# Patient Record
Sex: Female | Born: 1965
Health system: Southern US, Community
[De-identification: ages and names within clinical notes are randomized; demographics above are authoritative.]

## PROBLEM LIST (undated history)

## (undated) DIAGNOSIS — E785 Hyperlipidemia, unspecified: Secondary | ICD-10-CM

## (undated) DIAGNOSIS — I10 Essential (primary) hypertension: Secondary | ICD-10-CM

## (undated) DIAGNOSIS — G4730 Sleep apnea, unspecified: Secondary | ICD-10-CM

## (undated) DIAGNOSIS — E119 Type 2 diabetes mellitus without complications: Secondary | ICD-10-CM

## (undated) DIAGNOSIS — G4733 Obstructive sleep apnea (adult) (pediatric): Secondary | ICD-10-CM

## (undated) DIAGNOSIS — D649 Anemia, unspecified: Secondary | ICD-10-CM

## (undated) HISTORY — DX: Hyperlipidemia, unspecified: E78.5

## (undated) HISTORY — PX: REDUCTION MAMMAPLASTY: SUR839

## (undated) HISTORY — PX: APPENDECTOMY: SHX54

## (undated) HISTORY — DX: Obstructive sleep apnea (adult) (pediatric): G47.33

---

## 2001-08-24 HISTORY — PX: APPENDECTOMY: SHX54

## 2008-08-24 HISTORY — PX: REDUCTION MAMMAPLASTY: SUR839

## 2014-10-25 ENCOUNTER — Other Ambulatory Visit: Payer: Self-pay | Admitting: Family

## 2014-10-25 DIAGNOSIS — Z9889 Other specified postprocedural states: Secondary | ICD-10-CM

## 2014-10-25 DIAGNOSIS — Z1231 Encounter for screening mammogram for malignant neoplasm of breast: Secondary | ICD-10-CM

## 2014-11-01 ENCOUNTER — Ambulatory Visit
Admission: RE | Admit: 2014-11-01 | Discharge: 2014-11-01 | Disposition: A | Discharge status: Home / Self Care | Payer: 59 | Source: Ambulatory Visit | Attending: Family | Admitting: Family

## 2014-11-01 ENCOUNTER — Encounter (INDEPENDENT_AMBULATORY_CARE_PROVIDER_SITE_OTHER): Payer: Self-pay

## 2014-11-01 DIAGNOSIS — Z1231 Encounter for screening mammogram for malignant neoplasm of breast: Secondary | ICD-10-CM

## 2014-11-01 DIAGNOSIS — Z9889 Other specified postprocedural states: Secondary | ICD-10-CM

## 2015-08-28 ENCOUNTER — Other Ambulatory Visit (HOSPITAL_COMMUNITY)
Admission: RE | Admit: 2015-08-28 | Discharge: 2015-08-28 | Disposition: A | Discharge status: Home / Self Care | Payer: 59 | Source: Ambulatory Visit | Attending: Obstetrics & Gynecology | Admitting: Obstetrics & Gynecology

## 2015-08-28 ENCOUNTER — Other Ambulatory Visit: Payer: Self-pay | Admitting: Obstetrics & Gynecology

## 2015-08-28 DIAGNOSIS — Z01419 Encounter for gynecological examination (general) (routine) without abnormal findings: Secondary | ICD-10-CM | POA: Insufficient documentation

## 2015-08-28 DIAGNOSIS — Z1151 Encounter for screening for human papillomavirus (HPV): Secondary | ICD-10-CM | POA: Diagnosis not present

## 2015-09-02 LAB — CYTOLOGY - PAP

## 2015-09-03 ENCOUNTER — Ambulatory Visit: Payer: 59 | Admitting: *Deleted

## 2015-09-16 ENCOUNTER — Other Ambulatory Visit: Payer: Self-pay | Admitting: Obstetrics & Gynecology

## 2016-01-29 ENCOUNTER — Other Ambulatory Visit: Payer: Self-pay | Admitting: Family

## 2016-01-29 DIAGNOSIS — Z9889 Other specified postprocedural states: Secondary | ICD-10-CM

## 2016-01-29 DIAGNOSIS — Z1231 Encounter for screening mammogram for malignant neoplasm of breast: Secondary | ICD-10-CM

## 2016-01-31 ENCOUNTER — Ambulatory Visit
Admission: RE | Admit: 2016-01-31 | Discharge: 2016-01-31 | Disposition: A | Discharge status: Home / Self Care | Payer: 59 | Source: Ambulatory Visit | Attending: Family | Admitting: Family

## 2016-01-31 DIAGNOSIS — Z1231 Encounter for screening mammogram for malignant neoplasm of breast: Secondary | ICD-10-CM

## 2016-01-31 DIAGNOSIS — Z9889 Other specified postprocedural states: Secondary | ICD-10-CM

## 2017-03-16 ENCOUNTER — Other Ambulatory Visit: Payer: Self-pay | Admitting: Family

## 2017-03-16 DIAGNOSIS — Z1231 Encounter for screening mammogram for malignant neoplasm of breast: Secondary | ICD-10-CM

## 2017-03-24 ENCOUNTER — Ambulatory Visit
Admission: RE | Admit: 2017-03-24 | Discharge: 2017-03-24 | Disposition: A | Discharge status: Home / Self Care | Payer: 59 | Source: Ambulatory Visit | Attending: Family | Admitting: Family

## 2017-03-24 DIAGNOSIS — Z1231 Encounter for screening mammogram for malignant neoplasm of breast: Secondary | ICD-10-CM

## 2017-10-28 ENCOUNTER — Other Ambulatory Visit: Payer: Self-pay | Admitting: Gastroenterology

## 2017-12-10 NOTE — Progress Notes (Signed)
Left voice message on Endoscopy after hour voice mailbox making staff aware that an assessment was not completed because pt was unavailable. Pre-op instructions were left on pt voice mailbox according to pre-op checklist. Pt assessment will need to be completed upon pt arrival on DOS.

## 2017-12-12 NOTE — Anesthesia Preprocedure Evaluation (Addendum)
Anesthesia Evaluation  Patient identified by MRN, date of birth, ID band Patient awake    Reviewed: Allergy & Precautions, NPO status , Patient's Chart, lab work & pertinent test results  Airway Mallampati: I  TM Distance: >3 FB Neck ROM: Full    Dental  (+) Dental Advisory Given, Loose,    Pulmonary sleep apnea and Continuous Positive Airway Pressure Ventilation ,    Pulmonary exam normal breath sounds clear to auscultation       Cardiovascular hypertension, Pt. on medications (-) anginaNormal cardiovascular exam Rhythm:Regular Rate:Normal     Neuro/Psych negative neurological ROS  negative psych ROS   GI/Hepatic negative GI ROS, Neg liver ROS,   Endo/Other  diabetes, Poorly Controlled, Type 2, Insulin DependentMorbid obesity  Renal/GU negative Renal ROS  negative genitourinary   Musculoskeletal negative musculoskeletal ROS (+)   Abdominal (+) + obese,   Peds  Hematology negative hematology ROS (+)   Anesthesia Other Findings   Reproductive/Obstetrics                          Anesthesia Physical Anesthesia Plan  ASA: III  Anesthesia Plan: MAC   Post-op Pain Management:    Induction: Intravenous  PONV Risk Score and Plan: Propofol infusion and Treatment may vary due to age or medical condition  Airway Management Planned: Nasal Cannula and Natural Airway  Additional Equipment: None  Intra-op Plan:   Post-operative Plan:   Informed Consent: I have reviewed the patients History and Physical, chart, labs and discussed the procedure including the risks, benefits and alternatives for the proposed anesthesia with the patient or authorized representative who has indicated his/her understanding and acceptance.     Plan Discussed with: CRNA and Anesthesiologist  Anesthesia Plan Comments:         Anesthesia Quick Evaluation

## 2017-12-13 ENCOUNTER — Ambulatory Visit (HOSPITAL_COMMUNITY)
Admission: RE | Admit: 2017-12-13 | Discharge: 2017-12-13 | Disposition: A | Discharge status: Home / Self Care | Payer: 59 | Source: Ambulatory Visit | Attending: Gastroenterology | Admitting: Gastroenterology

## 2017-12-13 ENCOUNTER — Ambulatory Visit (HOSPITAL_COMMUNITY): Payer: 59 | Admitting: Anesthesiology

## 2017-12-13 ENCOUNTER — Other Ambulatory Visit: Payer: Self-pay

## 2017-12-13 ENCOUNTER — Encounter (HOSPITAL_COMMUNITY)
Admission: RE | Disposition: A | Discharge status: Home / Self Care | Payer: Self-pay | Source: Ambulatory Visit | Attending: Gastroenterology

## 2017-12-13 ENCOUNTER — Encounter (HOSPITAL_COMMUNITY): Payer: Self-pay

## 2017-12-13 DIAGNOSIS — E119 Type 2 diabetes mellitus without complications: Secondary | ICD-10-CM | POA: Insufficient documentation

## 2017-12-13 DIAGNOSIS — G4733 Obstructive sleep apnea (adult) (pediatric): Secondary | ICD-10-CM | POA: Insufficient documentation

## 2017-12-13 DIAGNOSIS — Z794 Long term (current) use of insulin: Secondary | ICD-10-CM | POA: Diagnosis not present

## 2017-12-13 DIAGNOSIS — I10 Essential (primary) hypertension: Secondary | ICD-10-CM | POA: Diagnosis not present

## 2017-12-13 DIAGNOSIS — D123 Benign neoplasm of transverse colon: Secondary | ICD-10-CM | POA: Diagnosis not present

## 2017-12-13 DIAGNOSIS — Z6841 Body Mass Index (BMI) 40.0 and over, adult: Secondary | ICD-10-CM | POA: Insufficient documentation

## 2017-12-13 DIAGNOSIS — Z1211 Encounter for screening for malignant neoplasm of colon: Secondary | ICD-10-CM | POA: Insufficient documentation

## 2017-12-13 HISTORY — DX: Type 2 diabetes mellitus without complications: E11.9

## 2017-12-13 HISTORY — DX: Essential (primary) hypertension: I10

## 2017-12-13 HISTORY — DX: Sleep apnea, unspecified: G47.30

## 2017-12-13 HISTORY — PX: COLONOSCOPY WITH PROPOFOL: SHX5780

## 2017-12-13 LAB — GLUCOSE, CAPILLARY: Glucose-Capillary: 227 mg/dL — ABNORMAL HIGH (ref 65–99)

## 2017-12-13 SURGERY — COLONOSCOPY WITH PROPOFOL
Anesthesia: Monitor Anesthesia Care

## 2017-12-13 MED ORDER — SODIUM CHLORIDE 0.9 % IV SOLN: INTRAVENOUS | Status: DC

## 2017-12-13 MED ORDER — LACTATED RINGERS IV SOLN
INTRAVENOUS | Status: DC | PRN
  Administered 2017-12-13: 09:00:00 via INTRAVENOUS

## 2017-12-13 MED ORDER — PROPOFOL 500 MG/50ML IV EMUL
INTRAVENOUS | Status: DC | PRN
  Administered 2017-12-13: 100 ug/kg/min via INTRAVENOUS

## 2017-12-13 MED ORDER — PROPOFOL 10 MG/ML IV BOLUS
INTRAVENOUS | Status: DC | PRN
  Administered 2017-12-13: 40 mg via INTRAVENOUS
  Administered 2017-12-13 (×2): 10 mg via INTRAVENOUS

## 2017-12-13 SURGICAL SUPPLY — 21 items

## 2017-12-13 NOTE — Transfer of Care (Signed)
Immediate Anesthesia Transfer of Care Note  Patient: Amy [name]  Procedure(s) Performed: COLONOSCOPY WITH PROPOFOL (N/A )  Patient Location: Endoscopy Unit  Anesthesia Type:MAC  Level of Consciousness: awake and patient cooperative  Airway & Oxygen Therapy: Patient Spontanous Breathing  Post-op Assessment: Report given to RN and Post -op Vital signs reviewed and stable  Post vital signs: Reviewed and stable  Last Vitals:  Vitals Value Taken Time  BP 122/47 12/13/2017  9:45 AM  Temp 36.5 C 12/13/2017  9:45 AM  Pulse    Resp 21 12/13/2017  9:46 AM  SpO2    Vitals shown include unvalidated device data.  Last Pain:  Vitals:   12/13/17 0945  TempSrc: Oral  PainSc: 0-No pain         Complications: No apparent anesthesia complications

## 2017-12-13 NOTE — Anesthesia Postprocedure Evaluation (Signed)
Anesthesia Post Note  Patient: Amy [name]  Procedure(s) Performed: COLONOSCOPY WITH PROPOFOL (N/A )     Patient location during evaluation: PACU Anesthesia Type: MAC Level of consciousness: awake and alert Pain management: pain level controlled Vital Signs Assessment: post-procedure vital signs reviewed and stable Respiratory status: spontaneous breathing, nonlabored ventilation and respiratory function stable Cardiovascular status: stable and blood pressure returned to baseline Anesthetic complications: no    Last Vitals:  Vitals:   12/13/17 0955 12/13/17 1005  BP: (!) 145/52 (!) 155/47  Pulse: 81 79  Resp: (!) 22 18  Temp:    SpO2: 100% 100%    Last Pain:  Vitals:   12/13/17 1005  TempSrc:   PainSc: 0-No pain                 Audry Pili

## 2017-12-13 NOTE — Anesthesia Procedure Notes (Signed)
Procedure Name: MAC Date/Time: 12/13/2017 9:12 AM Performed by: Lance Coon, CRNA Pre-anesthesia Checklist: Patient identified, Emergency Drugs available, Suction available, Patient being monitored and Timeout performed Patient Re-evaluated:Patient Re-evaluated prior to induction Oxygen Delivery Method: Nasal cannula

## 2017-12-13 NOTE — H&P (Signed)
Primary Care Physician:  Patient, No Pcp Per Primary Gastroenterologist:  Dr. Alessandra Bevels  Reason for Visit  :   HPI: Amy [name] is a 52 y.o. female  with past medical history of morbid obesity with BMI of more than 53, uncontrolled diabetes with hemoglobin A1c of 12.5, obstructive sleep apnea on CPAP, diabetes here for screening colonoscopy. Patient denies any GI symptoms today. She denies any blood in the stool or black stool. Denied any abdominal pain, nausea or vomiting. Denied chronic diarrhea or chronic constipation. Brother was diagnosed with large colon polyp requiring surgical resection in his 26s. Sisiter was also diagnosed with benign polyps. No family history of colon cancer. Patient has been anemic since early childhood which was attributed to heavy menstruation. Her hemoglobin has improved.   Past Medical History:  Diagnosis Date  . Diabetes mellitus without complication ([place])   . Hypertension   . Sleep apnea     Past Surgical History:  Procedure Laterality Date  . APPENDECTOMY      Prior to Admission medications   Medication Sig Start Date End Date Taking? Authorizing Provider  Cholecalciferol (VITAMIN D3 PO) Take 2 capsules by mouth daily.   Yes [provider]  ferrous sulfate 325 (65 FE) MG tablet Take 325 mg by mouth daily with breakfast.   Yes [provider]  Insulin Degludec (TRESIBA FLEXTOUCH) 200 UNIT/ML SOPN Inject 30 Units into the skin at bedtime.   Yes [provider]  insulin lispro (HUMALOG KWIKPEN) 100 UNIT/ML KiwkPen Inject 5-12 Units into the skin 3 (three) times daily. Per sliding scale   Yes [provider]  irbesartan-hydrochlorothiazide (AVALIDE) 300-12.5 MG tablet Take 1 tablet by mouth daily.   Yes [provider]  simvastatin (ZOCOR) 40 MG tablet Take 40 mg by mouth every evening.   Yes [provider]    Scheduled Meds: Continuous Infusions: . sodium chloride     PRN  Meds:.  Allergies as of 10/28/2017  . (Not on File)    History reviewed. No pertinent family history.  Social History   Socioeconomic History  . Marital status: Single    Spouse name: Not on file  . Number of children: Not on file  . Years of education: Not on file  . Highest education level: Not on file  Occupational History  . Not on file  Social Needs  . Financial resource strain: Not on file  . Food insecurity:    Worry: Not on file    Inability: Not on file  . Transportation needs:    Medical: Not on file    Non-medical: Not on file  Tobacco Use  . Smoking status: Never Smoker  . Smokeless tobacco: Never Used  Substance and Sexual Activity  . Alcohol use: Not on file  . Drug use: Not on file  . Sexual activity: Not on file  Lifestyle  . Physical activity:    Days per week: Not on file    Minutes per session: Not on file  . Stress: Not on file  Relationships  . Social connections:    Talks on phone: Not on file    Gets together: Not on file    Attends religious service: Not on file    Active member of club or organization: Not on file    Attends meetings of clubs or organizations: Not on file    Relationship status: Not on file  . Intimate partner violence:    Fear of current or ex partner:  Not on file    Emotionally abused: Not on file    Physically abused: Not on file    Forced sexual activity: Not on file  Other Topics Concern  . Not on file  Social History Narrative  . Not on file    Review of Systems: All negative except as stated above in HPI.  Physical Exam: Vital signs: Vitals:   12/13/17 0749  BP: (!) 170/63  Pulse: 92  Resp: (!) 22  Temp: 98.7 F (37.1 C)  SpO2: 99%     General:   Alert, morbidly obese, pleasant and cooperative in NAD Lungs:  Clear throughout to auscultation.   No wheezes, crackles, or rhonchi. No acute distress. Heart:  Regular rate and rhythm; no murmurs, clicks, rubs,  or gallops. Abdomen: obese, soft,  nontender, nondistended, bowel sounds present. No peritoneal signs   GI:  Lab Results: No results for input(s): WBC, HGB, HCT, PLT in the last 72 hours. BMET No results for input(s): NA, K, CL, CO2, GLUCOSE, BUN, CREATININE, CALCIUM in the last 72 hours. LFT No results for input(s): PROT, ALBUMIN, AST, ALT, ALKPHOS, BILITOT, BILIDIR, IBILI in the last 72 hours. PT/INR No results for input(s): LABPROT, INR in the last 72 hours.   Studies/Results: No results found.  Impression/Plan: Screening for colon cancer  Recommendations ----------------------------- - Proceed with colonoscopy today.  Risks (bleeding, infection, bowel perforation that could require surgery, sedation-related changes in cardiopulmonary systems), benefits (identification and possible treatment of source of symptoms, exclusion of certain causes of symptoms), and alternatives (watchful waiting, radiographic imaging studies, empiric medical treatment)  were explained to patient and family in detail and patient wishes to proceed.    LOS: 0 days   Otis Brace  MD, FACP 12/13/2017, 8:37 AM  Contact #  [phone]

## 2017-12-13 NOTE — Op Note (Addendum)
[facility] Patient Name: Amy [name] Procedure Date : 12/13/2017 MRN: [id number] Attending MD: Otis Brace , MD Date of Birth: [birth year] CSN: [id number] Age: 52 Admit Type: Outpatient Procedure:                Colonoscopy Indications:              Screening for colorectal malignant neoplasm, This                            is the patient's first colonoscopy Providers:                Otis Brace, MD, Kingsley Plan, RN, Charolette Child, Technician, Lance Coon, CRNA Referring MD:              Medicines:                Sedation Administered by an Anesthesia Professional Complications:            No immediate complications. Estimated Blood Loss:     Estimated blood loss was minimal. Procedure:                Pre-Anesthesia Assessment:                           - Prior to the procedure, a History and Physical                            was performed, and patient medications and                            allergies were reviewed. The patient's tolerance of                            previous anesthesia was also reviewed. The risks                            and benefits of the procedure and the sedation                            options and risks were discussed with the patient.                            All questions were answered, and informed consent                            was obtained. Prior Anticoagulants: The patient has                            taken no previous anticoagulant or antiplatelet                            agents. ASA Grade Assessment: III - A patient with  severe systemic disease. After reviewing the risks                            and benefits, the patient was deemed in                            satisfactory condition to undergo the procedure.                           After obtaining informed consent, the colonoscope                            was passed under direct vision.  Throughout the                            procedure, the patient's blood pressure, pulse, and                            oxygen saturations were monitored continuously. The                            EC-[id number] ([id number]) scope was introduced through                            the anus and advanced to the the cecum, identified                            by appendiceal orifice and ileocecal valve. The                            colonoscopy was technically difficult and complex                            due to a redundant colon. Successful completion of                            the procedure was aided by applying abdominal                            pressure. The patient tolerated the procedure well.                            The quality of the bowel preparation was fair. Scope In: 9:16:55 AM Scope Out: 9:40:04 AM Scope Withdrawal Time: 0 hours 12 minutes 8 seconds  Total Procedure Duration: 0 hours 23 minutes 9 seconds  Findings:      The perianal and digital rectal examinations were normal.      A moderate amount of liquid stool was found in the sigmoid colon, in the       ascending colon and in the cecum, interfering with visualization. Lavage       of the area was performed, resulting in clearance with fair       visualization.      A 8 mm polyp was found in the transverse colon. The polyp was  sessile.       The polyp was removed with a hot snare. Resection and retrieval were       complete.      There was moderate spasm in the sigmoid colon with limited distention of       the sigmoid colon..      Internal hemorrhoids were found during retroflexion. The hemorrhoids       were small. Impression:               - Preparation of the colon was fair.                           - Stool in the sigmoid colon, in the ascending                            colon and in the cecum.                           - One 8 mm polyp in the transverse colon, removed                            with a hot  snare. Resected and retrieved.                           - Moderate colonic spasm.                           - Internal hemorrhoids. Recommendation:           - Patient has a contact number available for                            emergencies. The signs and symptoms of potential                            delayed complications were discussed with the                            patient. Return to normal activities tomorrow.                            Written discharge instructions were provided to the                            patient.                           - Resume previous diet.                           - Continue present medications.                           - Await pathology results.                           - Repeat colonoscopy in 3 - 5 years for  surveillance based on pathology results.                           - Return to my office PRN. Procedure Code(s):        --- Professional ---                           [phone], Colonoscopy, flexible; with removal of                            tumor(s), polyp(s), or other lesion(s) by snare                            technique Diagnosis Code(s):        --- Professional ---                           Z12.11, Encounter for screening for malignant                            neoplasm of colon                           K64.8, Other hemorrhoids                           K58.9, Irritable bowel syndrome without diarrhea                           D12.3, Benign neoplasm of transverse colon (hepatic                            flexure or splenic flexure) CPT copyright 2017 American Medical Association. All rights reserved. The codes documented in this report are preliminary and upon coder review may  be revised to meet current compliance requirements. Otis Brace, MD Otis Brace, MD 12/13/2017 9:48:32 AM Number of Addenda: 0

## 2017-12-13 NOTE — Discharge Instructions (Signed)

## 2018-03-31 ENCOUNTER — Other Ambulatory Visit: Payer: Self-pay | Admitting: Nurse Practitioner

## 2018-03-31 DIAGNOSIS — Z1231 Encounter for screening mammogram for malignant neoplasm of breast: Secondary | ICD-10-CM

## 2018-04-26 ENCOUNTER — Ambulatory Visit
Admission: RE | Admit: 2018-04-26 | Discharge: 2018-04-26 | Disposition: A | Discharge status: Home / Self Care | Payer: 59 | Source: Ambulatory Visit | Attending: Nurse Practitioner | Admitting: Nurse Practitioner

## 2018-04-26 DIAGNOSIS — Z1231 Encounter for screening mammogram for malignant neoplasm of breast: Secondary | ICD-10-CM

## 2019-05-03 ENCOUNTER — Other Ambulatory Visit: Payer: Self-pay

## 2019-05-05 ENCOUNTER — Ambulatory Visit (INDEPENDENT_AMBULATORY_CARE_PROVIDER_SITE_OTHER): Payer: 59 | Admitting: Internal Medicine

## 2019-05-05 ENCOUNTER — Encounter: Payer: Self-pay | Admitting: Internal Medicine

## 2019-05-05 ENCOUNTER — Other Ambulatory Visit: Payer: Self-pay

## 2019-05-05 VITALS — BP 152/78 | HR 97 | Temp 98.7°F | Ht 65.0 in | Wt 336.4 lb

## 2019-05-05 DIAGNOSIS — R739 Hyperglycemia, unspecified: Secondary | ICD-10-CM | POA: Diagnosis not present

## 2019-05-05 DIAGNOSIS — Z794 Long term (current) use of insulin: Secondary | ICD-10-CM

## 2019-05-05 DIAGNOSIS — E1165 Type 2 diabetes mellitus with hyperglycemia: Secondary | ICD-10-CM

## 2019-05-05 DIAGNOSIS — I10 Essential (primary) hypertension: Secondary | ICD-10-CM

## 2019-05-05 DIAGNOSIS — E785 Hyperlipidemia, unspecified: Secondary | ICD-10-CM | POA: Diagnosis not present

## 2019-05-05 LAB — GLUCOSE, POCT (MANUAL RESULT ENTRY): POC Glucose: 296 mg/dl — AB (ref 70–99)

## 2019-05-05 MED ORDER — OZEMPIC (0.25 OR 0.5 MG/DOSE) 2 MG/1.5ML ~~LOC~~ SOPN: 0.5000 mg | PEN_INJECTOR | SUBCUTANEOUS | 3 refills | Status: DC

## 2019-05-05 NOTE — Progress Notes (Signed)
Name: Amy [name]  MRN/ DOB: [id number], 04/12/1966   Age/ Sex: 53 y.o., female    PCP: Leeroy Cha, MD   Reason for Endocrinology Evaluation: Type 2  Diabetes Mellitus     Date of Initial Endocrinology Visit: 05/05/2019     PATIENT IDENTIFIER: Amy [name] is a 53 y.o. female with a past medical history of HTN,T2DM, OSA and anemia. The patient presented for initial endocrinology clinic visit on 05/05/2019 for consultative assistance with her diabetes management.    HPI: Amy [name] was    Diagnosed with DM 2010 Prior Medications tried/Intolerance: Metformin- Diarrhea. Has been on insulin for years Currently checking blood sugars occasionally  Hypoglycemia episodes : no              Hemoglobin A1c has ranged from 7.9% in 2011, peaking at 12.0% in 2019. Patient required assistance for hypoglycemia: no Patient has required hospitalization within the last 1 year from hyper or hypoglycemia: no   In terms of diet, the patient eats 3 meals day, snacks 2 x a day, drinks  Diet drinks.   Used to see Dr. Chalmers Cater    Works from home for [employer]  She moved here to be near her daughter while going through college but daughter has graduated and she is working in [place]: Humalog SS  Tresiba 30-40 units daily   Statin: yes ACE-I/ARB: yes Prior Diabetic Education:No   METER DOWNLOAD SUMMARY: Did not bring    DIABETIC COMPLICATIONS: Microvascular complications:    Denies: CKD, neuropathy , retinopathy   Last eye exam: Completed 2019  Macrovascular complications:    Denies: CAD, PVD, CVA   PAST HISTORY: Past Medical History:  Past Medical History:  Diagnosis Date  . Diabetes mellitus without complication ([place])   . Hypertension   . Sleep apnea    Past Surgical History:  Past Surgical History:  Procedure Laterality Date  . APPENDECTOMY    . COLONOSCOPY WITH PROPOFOL N/A 12/13/2017   Procedure: COLONOSCOPY  WITH PROPOFOL;  Surgeon: Otis Brace, MD;  Location: [place];  Service: Gastroenterology;  Laterality: N/A;  . REDUCTION MAMMAPLASTY Bilateral       Social History:  reports that she has never smoked. She has never used smokeless tobacco. No history on file for alcohol and drug. Family History:  Family History  Problem Relation Age of Onset  . Breast cancer Cousin   . Aneurysm Father   . Diabetes Brother      HOME MEDICATIONS: Allergies as of 05/05/2019      Reactions   Lisinopril Swelling      Medication List       Accurate as of May 05, 2019  2:58 PM. If you have any questions, ask your nurse or doctor.        ferrous sulfate 325 (65 FE) MG tablet Take 325 mg by mouth daily with breakfast.   HumaLOG KwikPen 100 UNIT/ML KiwkPen Generic drug: insulin lispro Inject 5-12 Units into the skin 3 (three) times daily. Per sliding scale   irbesartan-hydrochlorothiazide 300-12.5 MG tablet Commonly known as: AVALIDE Take 1 tablet by mouth daily.   simvastatin 40 MG tablet Commonly known as: ZOCOR Take 40 mg by mouth every evening.   Tyler Aas FlexTouch 200 UNIT/ML Sopn Generic drug: Insulin Degludec Inject 40 Units into the skin at bedtime.   VITAMIN D3 PO Take 2 capsules by mouth daily.        ALLERGIES: Allergies  Allergen Reactions  . Lisinopril Swelling     REVIEW OF SYSTEMS: A comprehensive ROS was conducted with the patient and is negative except as per HPI and below:  Review of Systems  Constitutional: Negative for fever and weight loss.  HENT: Negative for congestion and sore throat.   Eyes: Negative for blurred vision and pain.  Respiratory: Negative for cough and shortness of breath.   Cardiovascular: Negative for chest pain and palpitations.  Gastrointestinal: Negative for diarrhea and nausea.  Genitourinary: Negative for frequency.  Skin: Negative.   Neurological: Negative for tingling and tremors.  Endo/Heme/Allergies: Negative  for polydipsia.  Psychiatric/Behavioral: Negative for depression. The patient is not nervous/anxious.       OBJECTIVE:   VITAL SIGNS: BP (!) 152/78 (BP Location: Left Arm, Patient Position: Sitting, Cuff Size: Large)   Pulse 97   Temp 98.7 F (37.1 C)   Ht 5\' 5"  (1.651 m)   Wt (!) 336 lb 6.4 oz (152.6 kg)   SpO2 97%   BMI 55.98 kg/m    PHYSICAL EXAM:  General: Pt appears well and is in NAD  Hydration: Well-hydrated with moist mucous membranes and good skin turgor  HEENT: Head: Unremarkable with good dentition. Oropharynx clear without exudate.  Eyes: External eye exam normal without stare, lid lag or exophthalmos.  EOM intact.   Neck: General: Supple without adenopathy or carotid bruits. Thyroid: Thyroid size normal.  No goiter or nodules appreciated. No thyroid bruit.  Lungs: Clear with good BS bilat with no rales, rhonchi, or wheezes  Heart: RRR with normal S1 and S2 and no gallops; no murmurs; no rub  Abdomen: Normoactive bowel sounds, soft, nontender, without masses or organomegaly palpable  Extremities:  Lower extremities - No pretibial edema. No lesions.  Skin: Normal texture and temperature to palpation. No rash noted.   Neuro: MS is good with appropriate affect, pt is alert and Ox3    DM foot exam: 05/05/2019  The skin of the feet is intact without sores or ulcerations. The pedal pulses are 2+ on right and 2+ on left. The sensation is intact to a screening 5.07, 10 gram monofilament bilaterally   DATA REVIEWED: 04/05/19 TG 110 LDL 112 HDL 35  Bun/Cr 12/0.7 GFR 87 K 4.5 Ca 8.9  A1c 11.0  %  ASSESSMENT / PLAN / RECOMMENDATIONS:   1) Type 2 Diabetes Mellitus, Poorly controlled, Without complications - Most recent A1c of 11.0 %. Goal A1c < 7.0 %.    Plan: GENERAL:  Its clearly she is not taking her insulin consistently, I am not clear how much tresiba she takes, at the beginning of the office visit , she said she takes 30 units, by the end of the visit  when I told her the dose is written for 40 she claimed she has been taking 40 units ?  I have discussed with the patient the pathophysiology of diabetes. We went over the natural progression of the disease. We talked about both insulin resistance and insulin deficiency. We stressed the importance of lifestyle changes including diet and exercise. I explained the complications associated with diabetes including retinopathy, nephropathy, neuropathy as well as increased risk of cardiovascular disease. We went over the benefit seen with glycemic control.   I explained to the patient that diabetic patients are at higher than normal risk for amputations.    She is intolerant to Metformin   We discussed adding a GLP-1 agonist , discussed benefits and GI side effects, she has no personal  hx of pancreatitis nor a FH of medullary thyroid cancer.    MEDICATIONS: - Start Ozempic 0.25 mg once a week for 6 weeks, if no vomiting/diarrhea, increase to 0.5 mg after 6 weeks -  Tresiba to 34 units once a day  - Humalog 10 units with each meal - CF : Add 2 units of humalog with pre-meal glucose of > 200 mg/dL   EDUCATION / INSTRUCTIONS:  BG monitoring instructions: Patient is instructed to check her blood sugars 4 times a day, before meals and bedtime.  Call [facility] if: BG persistently < 70 or > 300. . I reviewed the Rule of 15 for the treatment of hypoglycemia in detail with the patient. Literature supplied.   2) Diabetic complications:   Eye: Does not have known diabetic retinopathy.   Neuro/ Feet: Does not have known diabetic peripheral neuropathy.  Renal: Patient does not have known baseline CKD. She is  on an ACEI/ARB at present.  3) Dyslipidemia : Pt is on Simvastatin 40 mg daily    4) Hypertension: She is above  goal of < 140/90 mmHg. Asymptomatic .Will defer further  management to PCP. I question compliance.     F/U in 3 months   Signed electronically by: Mack Guise, MD  [facility]  [facility] [address] [place], [place] [address] Phone: [phone] FAX: [phone]   CC: Leeroy Cha, [place] [place] [address] Phone: [phone]  Fax: [phone]    Return to Endocrinology clinic as below: Future Appointments  Date Time Provider [place]  05/05/2019  3:00 PM Tyrome Donatelli, Melanie Crazier, MD [facility] None

## 2019-05-05 NOTE — Patient Instructions (Signed)
-   Start Ozempic 0.25 mg once a week for 6 weeks, if no vomiting/diarrhea, increase to 0.5 mg after 6 weeks  - Increase Tresiba to 34 units once a day   - Humalog 10 units with each meal , but if your pre-meal sugar is over 200, take 12 units with that meal   - Choose healthy, lower carb lower calorie snacks: toss salad, cooked vegetables, cottage cheese, peanut butter, low fat cheese / string cheese, lower sodium deli meat, tuna salad or chicken salad     HOW TO TREAT LOW BLOOD SUGARS (Blood sugar LESS THAN 70 MG/DL)  Please follow the RULE OF 15 for the treatment of hypoglycemia treatment (when your (blood sugars are less than 70 mg/dL)    STEP 1: Take 15 grams of carbohydrates when your blood sugar is low, which includes:   3-4 GLUCOSE TABS  OR  3-4 OZ OF JUICE OR REGULAR SODA OR  ONE TUBE OF GLUCOSE GEL     STEP 2: RECHECK blood sugar in 15 MINUTES STEP 3: If your blood sugar is still low at the 15 minute recheck --> then, go back to STEP 1 and treat AGAIN with another 15 grams of carbohydrates.

## 2019-05-07 DIAGNOSIS — I10 Essential (primary) hypertension: Secondary | ICD-10-CM | POA: Insufficient documentation

## 2019-05-07 DIAGNOSIS — Z794 Long term (current) use of insulin: Secondary | ICD-10-CM | POA: Insufficient documentation

## 2019-05-07 DIAGNOSIS — E1165 Type 2 diabetes mellitus with hyperglycemia: Secondary | ICD-10-CM | POA: Insufficient documentation

## 2019-05-07 DIAGNOSIS — E785 Hyperlipidemia, unspecified: Secondary | ICD-10-CM | POA: Insufficient documentation

## 2019-05-10 ENCOUNTER — Telehealth: Payer: Self-pay

## 2019-05-10 NOTE — Telephone Encounter (Signed)
Approval for ozempic from [employer] [facility] reference # [id number] until 05/08/2020

## 2019-05-22 ENCOUNTER — Other Ambulatory Visit: Payer: Self-pay | Admitting: Internal Medicine

## 2019-05-22 DIAGNOSIS — Z1231 Encounter for screening mammogram for malignant neoplasm of breast: Secondary | ICD-10-CM

## 2019-06-19 ENCOUNTER — Ambulatory Visit
Admission: RE | Admit: 2019-06-19 | Discharge: 2019-06-19 | Disposition: A | Discharge status: Home / Self Care | Payer: 59 | Source: Ambulatory Visit | Attending: Internal Medicine | Admitting: Internal Medicine

## 2019-06-19 ENCOUNTER — Other Ambulatory Visit: Payer: Self-pay

## 2019-06-19 DIAGNOSIS — Z1231 Encounter for screening mammogram for malignant neoplasm of breast: Secondary | ICD-10-CM

## 2019-08-07 ENCOUNTER — Ambulatory Visit: Payer: 59 | Admitting: Internal Medicine

## 2019-10-06 IMAGING — MG DIGITAL SCREENING BILATERAL MAMMOGRAM WITH CAD
5 series · 5 of 5 positions shown · non-contrast
Comparison: Previous exam(s).

CLINICAL DATA: Screening.

EXAM:
DIGITAL SCREENING BILATERAL MAMMOGRAM WITH CAD

[L CC]
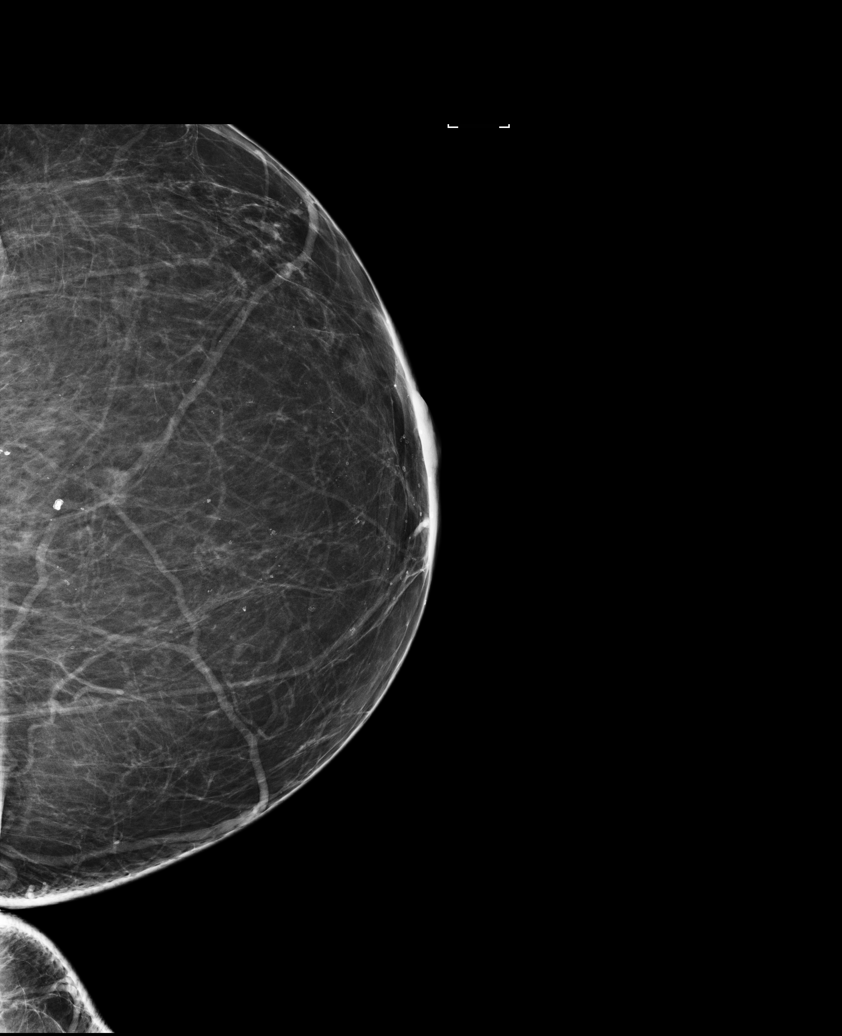

[R CC]
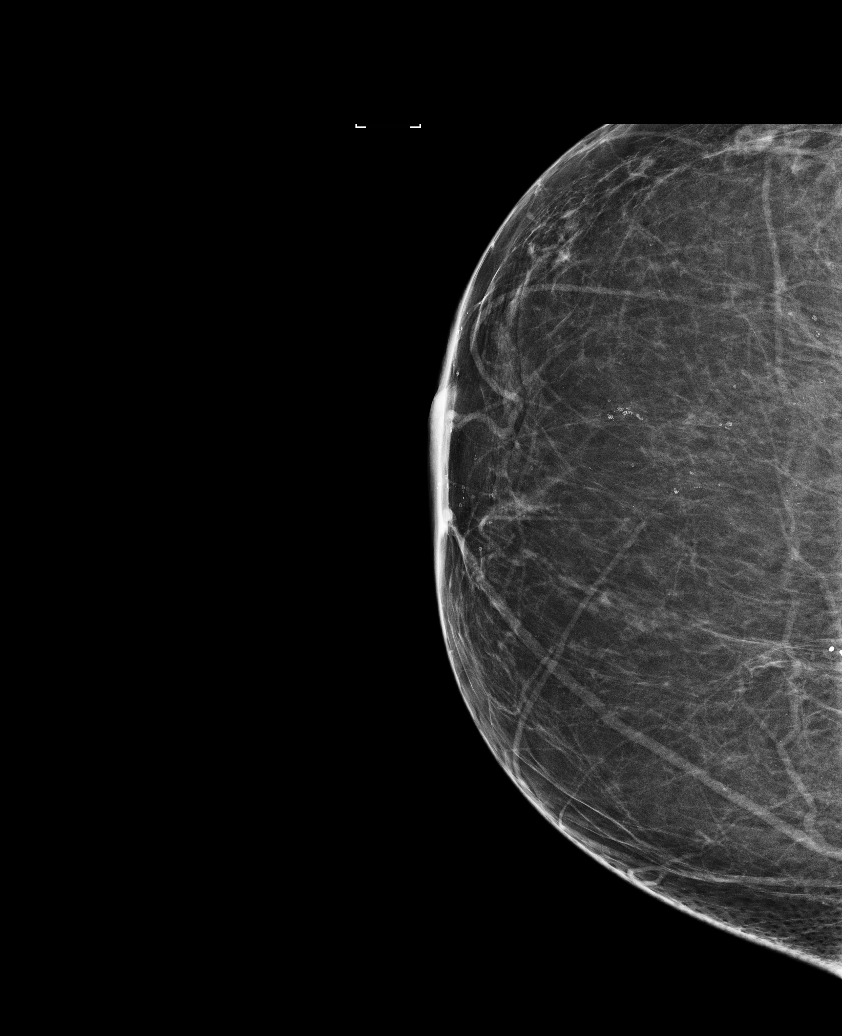

[L MLO (1 of 2)]
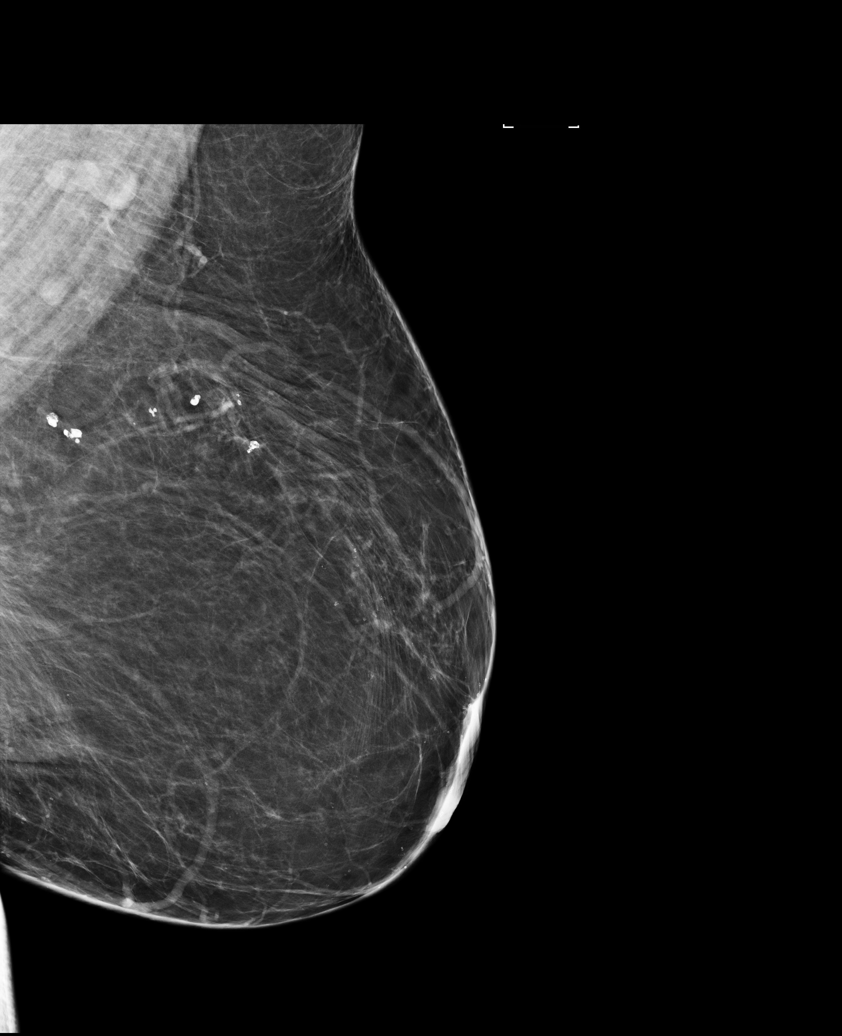

[R MLO]
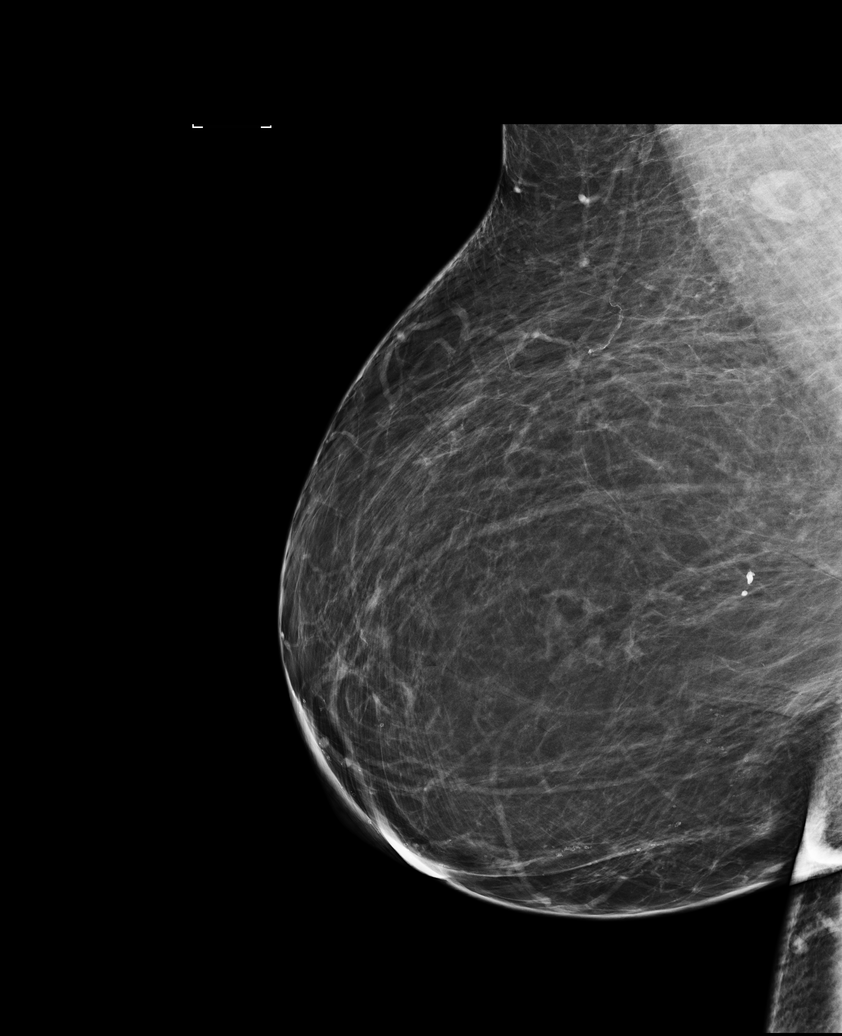

[L MLO (2 of 2)]
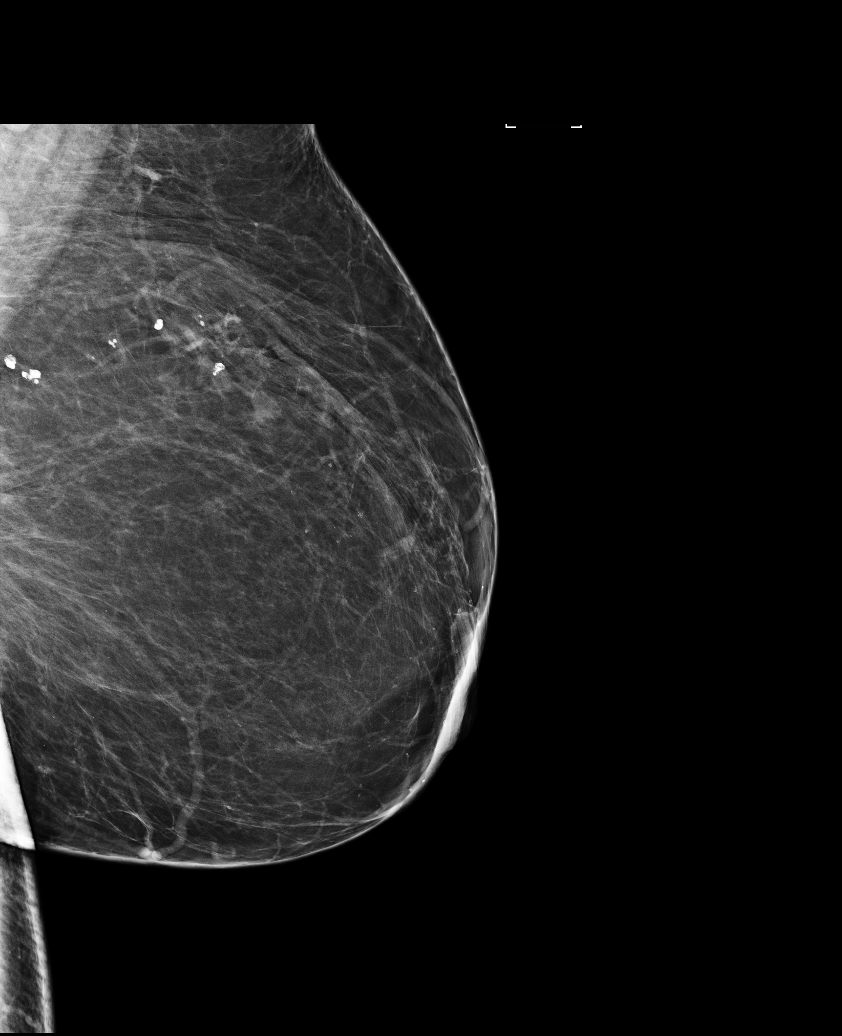

[5 of 5 positions shown; findings below may reference images not displayed]

ACR Breast Density Category b: There are scattered areas of
fibroglandular density.
FINDINGS: There are no findings suspicious for malignancy. Images were
processed with CAD.
IMPRESSION: No mammographic evidence of malignancy. A result letter of this
screening mammogram will be mailed directly to the patient.

RECOMMENDATION:
Screening mammogram in one year. (Code:AS-G-LCT)

BI-RADS CATEGORY  1: Negative.

## 2019-11-03 ENCOUNTER — Telehealth: Payer: Self-pay | Admitting: Internal Medicine

## 2019-11-03 NOTE — Telephone Encounter (Signed)
Patient called to check on status of PA for tresiba. Ph# [phone]

## 2019-11-06 ENCOUNTER — Other Ambulatory Visit: Payer: Self-pay

## 2019-11-06 MED ORDER — TRESIBA FLEXTOUCH 200 UNIT/ML ~~LOC~~ SOPN: 34.0000 [IU] | PEN_INJECTOR | Freq: Every day | SUBCUTANEOUS | 3 refills | Status: DC

## 2019-11-06 NOTE — Telephone Encounter (Signed)
Patient returned call to advise that RX needs to go to [place] on [address] instead of [place].  Please send [place] to [place].  Any questions or clarifications you can contact patient at [phone]

## 2019-11-06 NOTE — Telephone Encounter (Signed)
[facility] is not even in pt chart. So medication was never sent there.

## 2019-11-06 NOTE — Telephone Encounter (Signed)
A PA for tresiba has not been started for this pt. I have not received anything that stated that she needed one. I will call pt to inform.

## 2019-11-27 ENCOUNTER — Other Ambulatory Visit: Payer: Self-pay | Admitting: Internal Medicine

## 2019-11-27 MED ORDER — LANTUS SOLOSTAR 100 UNIT/ML ~~LOC~~ SOPN: 34.0000 [IU] | PEN_INJECTOR | Freq: Every day | SUBCUTANEOUS | 2 refills | Status: DC

## 2020-04-16 ENCOUNTER — Telehealth: Payer: Self-pay

## 2020-04-16 NOTE — Telephone Encounter (Signed)
PA has been initiated via [email or web address] for Ozempic.   Key: [id number]) Ozempic (0.25 or 0.5 MG/DOSE) 2MG /1.5ML pen-injectors  Form: [facility] Electronic Prior Authorization Form (2017 NCPDP)  Plan Response 1 hour ago Submit Clinical Questions less than a minute ago Determination Wait for Determination Please wait for [facility] 2017 NCPDP to return a determination.

## 2020-04-16 NOTE — Telephone Encounter (Signed)
PA has been approved.  Request Reference Number: [id number]. OZEMPIC INJ 2/1.5ML is approved through 04/16/2021. Your patient may now fill this prescription and it will be covered.

## 2020-05-08 ENCOUNTER — Other Ambulatory Visit: Payer: Self-pay | Admitting: Internal Medicine

## 2020-05-16 ENCOUNTER — Other Ambulatory Visit: Payer: Self-pay | Admitting: Internal Medicine

## 2020-05-16 DIAGNOSIS — Z1231 Encounter for screening mammogram for malignant neoplasm of breast: Secondary | ICD-10-CM

## 2020-06-20 ENCOUNTER — Other Ambulatory Visit: Payer: Self-pay

## 2020-06-20 ENCOUNTER — Ambulatory Visit
Admission: RE | Admit: 2020-06-20 | Discharge: 2020-06-20 | Disposition: A | Discharge status: Home / Self Care | Payer: 59 | Source: Ambulatory Visit | Attending: Internal Medicine | Admitting: Internal Medicine

## 2020-06-20 DIAGNOSIS — Z1231 Encounter for screening mammogram for malignant neoplasm of breast: Secondary | ICD-10-CM

## 2021-03-06 ENCOUNTER — Other Ambulatory Visit: Payer: Self-pay | Admitting: Nurse Practitioner

## 2021-03-06 DIAGNOSIS — R102 Pelvic and perineal pain: Secondary | ICD-10-CM

## 2021-03-19 ENCOUNTER — Telehealth: Payer: Self-pay | Admitting: Pharmacy Technician

## 2021-03-19 NOTE — Telephone Encounter (Signed)
Patient Advocate Encounter   Received notification from [facility] that prior authorization for [facility] is required.   PA submitted on 03/19/2021 Key [id number] Status is pending    [facility] will continue to follow   Ronney Asters, CPhT Patient [facility] Phone: [phone] Fax:  [phone]

## 2021-03-20 ENCOUNTER — Other Ambulatory Visit (HOSPITAL_COMMUNITY): Payer: Self-pay

## 2021-03-20 NOTE — Telephone Encounter (Signed)
Patient Advocate Encounter   Received notification from [place] that prior authorization for Ozempic is required.   PA submitted on 03/19/21  Key [id number] Status is denied. No PA needed. Pharmacy needs to bill for 1 month at a time.     [facility] was called to inform them to reprocess. They said the RX is expired.  Also due to previous note, PA on file until 04/16/2021   Armanda Magic, CPhT [facility] Phone: [phone] Fax:  [phone]

## 2021-03-26 ENCOUNTER — Other Ambulatory Visit: Payer: 59

## 2021-04-02 ENCOUNTER — Other Ambulatory Visit: Payer: Self-pay

## 2021-04-02 ENCOUNTER — Ambulatory Visit
Admission: RE | Admit: 2021-04-02 | Discharge: 2021-04-02 | Disposition: A | Discharge status: Home / Self Care | Payer: 59 | Source: Ambulatory Visit | Attending: Nurse Practitioner | Admitting: Nurse Practitioner

## 2021-04-02 DIAGNOSIS — R102 Pelvic and perineal pain: Secondary | ICD-10-CM

## 2021-04-18 ENCOUNTER — Other Ambulatory Visit: Payer: Self-pay

## 2021-04-18 ENCOUNTER — Ambulatory Visit (INDEPENDENT_AMBULATORY_CARE_PROVIDER_SITE_OTHER): Payer: 59 | Admitting: Internal Medicine

## 2021-04-18 ENCOUNTER — Encounter: Payer: Self-pay | Admitting: Internal Medicine

## 2021-04-18 VITALS — BP 164/88 | HR 91 | Ht 66.0 in | Wt 329.0 lb

## 2021-04-18 DIAGNOSIS — E1165 Type 2 diabetes mellitus with hyperglycemia: Secondary | ICD-10-CM | POA: Diagnosis not present

## 2021-04-18 DIAGNOSIS — Z794 Long term (current) use of insulin: Secondary | ICD-10-CM

## 2021-04-18 LAB — MICROALBUMIN / CREATININE URINE RATIO
Creatinine,U: 183.6 mg/dL
Microalb Creat Ratio: 3.8 mg/g (ref 0.0–30.0)
Microalb, Ur: 7 mg/dL — ABNORMAL HIGH (ref 0.0–1.9)

## 2021-04-18 LAB — BASIC METABOLIC PANEL
BUN: 15 mg/dL (ref 6–23)
CO2: 26 mEq/L (ref 19–32)
Calcium: 9.7 mg/dL (ref 8.4–10.5)
Chloride: 98 mEq/L (ref 96–112)
Creatinine, Ser: 0.81 mg/dL (ref 0.40–1.20)
GFR: 80.77 mL/min (ref >60.00)
Glucose, Bld: 251 mg/dL — ABNORMAL HIGH (ref 70–99)
Potassium: 3.6 mEq/L (ref 3.5–5.1)
Sodium: 136 mEq/L (ref 135–145)

## 2021-04-18 LAB — LIPID PANEL
Cholesterol: 207 mg/dL — ABNORMAL HIGH (ref 0–200)
HDL: 39.7 mg/dL (ref >39.00)
LDL Cholesterol: 144 mg/dL — ABNORMAL HIGH (ref 0–99)
NonHDL: 167.43
Total CHOL/HDL Ratio: 5
Triglycerides: 117 mg/dL (ref 0.0–149.0)
VLDL: 23.4 mg/dL (ref 0.0–40.0)

## 2021-04-18 LAB — T4, FREE: Free T4: 0.94 ng/dL (ref 0.60–1.60)

## 2021-04-18 LAB — POCT GLYCOSYLATED HEMOGLOBIN (HGB A1C): Hemoglobin A1C: 11.7 % — AB (ref 4.0–5.6)

## 2021-04-18 LAB — TSH: TSH: 2.08 u[IU]/mL (ref 0.35–5.50)

## 2021-04-18 MED ORDER — IRBESARTAN-HYDROCHLOROTHIAZIDE 300-12.5 MG PO TABS: 1.0000 | ORAL_TABLET | Freq: Every day | ORAL | 1 refills | Status: DC

## 2021-04-18 MED ORDER — TRESIBA FLEXTOUCH 100 UNIT/ML ~~LOC~~ SOPN: 40.0000 [IU] | PEN_INJECTOR | Freq: Every day | SUBCUTANEOUS | 2 refills | Status: DC

## 2021-04-18 MED ORDER — INSULIN PEN NEEDLE 31G X 5 MM MISC: 1.0000 | Freq: Four times a day (QID) | 3 refills | Status: DC

## 2021-04-18 MED ORDER — INSULIN LISPRO (1 UNIT DIAL) 100 UNIT/ML (KWIKPEN): 12.0000 [IU] | PEN_INJECTOR | Freq: Three times a day (TID) | SUBCUTANEOUS | 2 refills | Status: DC

## 2021-04-18 MED ORDER — OZEMPIC (0.25 OR 0.5 MG/DOSE) 2 MG/1.5ML ~~LOC~~ SOPN: 0.5000 mg | PEN_INJECTOR | SUBCUTANEOUS | 2 refills | Status: DC

## 2021-04-18 NOTE — Patient Instructions (Addendum)
-   Start Ozempic 0.25 mg once a week for 6 weeks, then increase to 0.5 mg after 6 weeks  - Start Tresiba 40  units once a day   - Humalog 12 units with each meal     HOW TO TREAT LOW BLOOD SUGARS (Blood sugar LESS THAN 70 MG/DL) Please follow the RULE OF 15 for the treatment of hypoglycemia treatment (when your (blood sugars are less than 70 mg/dL)   STEP 1: Take 15 grams of carbohydrates when your blood sugar is low, which includes:  3-4 GLUCOSE TABS  OR 3-4 OZ OF JUICE OR REGULAR SODA OR ONE TUBE OF GLUCOSE GEL    STEP 2: RECHECK blood sugar in 15 MINUTES STEP 3: If your blood sugar is still low at the 15 minute recheck --> then, go back to STEP 1 and treat AGAIN with another 15 grams of carbohydrates.

## 2021-04-18 NOTE — Progress Notes (Signed)
Name: Amy [name]  Age/ Sex: 55 y.o., female   MRN/ DOB: [id number], Aug 30, 1965     PCP: Leeroy Cha, MD   Reason for Endocrinology Evaluation: Type 2 Diabetes Mellitus  Initial Endocrine Consultative Visit: 05/05/2019    PATIENT IDENTIFIER: Amy [name] is a 55 y.o. female with a past medical history of HTN,T2DM, OSA and anemia. The patient has followed with Endocrinology clinic since 05/05/2019 for consultative assistance with management of her diabetes.  DIABETIC HISTORY:  Amy [name] was diagnosed with DM in 2010, metformin caused diarrhea Has been on insulin for years. Her hemoglobin A1c has ranged from  7.9% in 2011, peaking at 12.0% in 2019.  Transitioned care from Dr. Chalmers Cater in 2020 . Her initial A1c at our clinic was 11.0% . We started her on Ozempic and adjusted MDI regimen but was lost to follow up for 2 years prior to her return again in 03/2021   She moved here to be near her daughter while going through college but daughter has graduated and she is working in [place]:   During the last visit (9/111/2020): A1c 11.0% . Started Ozempic, adjusted MDI regimen   Today (04/18/2021): Amy [name]  is here for a follow up on diabetes management. She has NOT been to our clinic in 2 years . She checks her blood sugars occasionally  times daily. The patient has not had hypoglycemic episodes since the last clinic visit   Denies recent nausea or diarrhea   Ran out of Ozempic and basal insulin   Currently only  taking Humalog   HOME DIABETES REGIMEN:  Lantus - has been out  Humalog 15-20 units per meal  Ozempic - not taking    Statin: yes ACE-I/ARB: yes Prior Diabetic Education: no   METER DOWNLOAD SUMMARY: Did not bring   DIABETIC COMPLICATIONS: Microvascular complications:   Denies: CKD, neuropathy, retinopathy  Last Eye Exam: Completed 2021  Macrovascular complications:   Denies: CAD, CVA, PVD   HISTORY:  Past Medical  History:  Past Medical History:  Diagnosis Date   Diabetes mellitus without complication ([place])    Hypertension    Sleep apnea    Past Surgical History:  Past Surgical History:  Procedure Laterality Date   APPENDECTOMY     COLONOSCOPY WITH PROPOFOL N/A 12/13/2017   Procedure: COLONOSCOPY WITH PROPOFOL;  Surgeon: Otis Brace, MD;  Location: [facility];  Service: Gastroenterology;  Laterality: N/A;   REDUCTION MAMMAPLASTY Bilateral    Social History:  reports that she has never smoked. She has never used smokeless tobacco. No history on file for alcohol use and drug use. Family History:  Family History  Problem Relation Age of Onset   Breast cancer Cousin    Aneurysm Father    Diabetes Brother      HOME MEDICATIONS: Allergies as of 04/18/2021       Reactions   Lisinopril Swelling        Medication List        Accurate as of April 18, 2021  2:57 PM. If you have any questions, ask your nurse or doctor.          ferrous sulfate 325 (65 FE) MG tablet Take 325 mg by mouth daily with breakfast.   insulin lispro 100 UNIT/ML KiwkPen Commonly known as: HUMALOG Inject 10 Units into the skin 3 (three) times daily. Per sliding scale   irbesartan-hydrochlorothiazide 300-12.5 MG tablet Commonly known as: AVALIDE Take 1 tablet by mouth daily.  Lantus SoloStar 100 UNIT/ML Solostar Pen Generic drug: insulin glargine Inject 34 Units into the skin daily.   Ozempic (0.25 or 0.5 MG/DOSE) 2 MG/1.5ML Sopn Generic drug: Semaglutide(0.25 or 0.'5MG'$ /DOS) Inject 0.5 mg into the skin once a week.   simvastatin 40 MG tablet Commonly known as: ZOCOR Take 40 mg by mouth every evening.   VITAMIN D3 PO Take 2 capsules by mouth daily.         OBJECTIVE:   Vital Signs: BP (!) 164/88   Pulse 91   Ht '5\' 6"'$  (1.676 m)   Wt (!) 329 lb (149.2 kg)   SpO2 96%   BMI 53.10 kg/m   Wt Readings from Last 3 Encounters:  04/18/21 (!) 329 lb (149.2 kg)  05/05/19 (!) 336 lb 6.4  oz (152.6 kg)  12/13/17 (!) 317 lb (143.8 kg)     Exam: General: Pt appears well and is in NAD  Neck: General: Supple without adenopathy. Thyroid: Thyroid size normal.  No goiter or nodules appreciated.  Lungs: Clear with good BS bilat with no rales, rhonchi, or wheezes  Heart: RRR with normal S1 and S2 and no gallops; no murmurs; no rub  Abdomen: Normoactive bowel sounds, soft, nontender, without masses or organomegaly palpable  Extremities: No pretibial edema.   Neuro: MS is good with appropriate affect, pt is alert and Ox3    DM foot exam: 04/18/2021  The skin of the feet is intact without sores or ulcerations. The pedal pulses are 1+ on right and 1+ on left. The sensation is intact to a screening 5.07, 10 gram monofilament bilaterally          DATA REVIEWED:  Lab Results  Component Value Date   HGBA1C 11.7 (A) 04/18/2021   Results for Amy [name], Amy [name] (MRN [id number]) as of 04/21/2021 13:19  Ref. Range 04/18/2021 15:22  Sodium Latest Ref Range: 135 - 145 mEq/L 136  Potassium Latest Ref Range: 3.5 - 5.1 mEq/L 3.6  Chloride Latest Ref Range: 96 - 112 mEq/L 98  CO2 Latest Ref Range: 19 - 32 mEq/L 26  Glucose Latest Ref Range: 70 - 99 mg/dL 251 (H)  BUN Latest Ref Range: 6 - 23 mg/dL 15  Creatinine Latest Ref Range: 0.40 - 1.20 mg/dL 0.81  Calcium Latest Ref Range: 8.4 - 10.5 mg/dL 9.7  GFR Latest Ref Range: >60.00 mL/min 80.77  Total CHOL/HDL Ratio Unknown 5  Cholesterol Latest Ref Range: 0 - 200 mg/dL 207 (H)  HDL Cholesterol Latest Ref Range: >39.00 mg/dL 39.70  LDL (calc) Latest Ref Range: 0 - 99 mg/dL 144 (H)  MICROALB/CREAT RATIO Latest Ref Range: 0.0 - 30.0 mg/g 3.8  NonHDL Unknown 167.43  Triglycerides Latest Ref Range: 0.0 - 149.0 mg/dL 117.0  VLDL Latest Ref Range: 0.0 - 40.0 mg/dL 23.4  TSH Latest Ref Range: 0.35 - 5.50 uIU/mL 2.08  T4,Free(Direct) Latest Ref Range: 0.60 - 1.60 ng/dL 0.94  Creatinine,U Latest Units: mg/dL 183.6  Microalb, Ur Latest Ref  Range: 0.0 - 1.9 mg/dL 7.0 (H)    ASSESSMENT / PLAN / RECOMMENDATIONS:   1) Type 2 Diabetes Mellitus, Poorly controlled, With out complications - Most recent A1c of 11.7 %. Goal A1c < 7.0 %.    - Poorly controlled diabetes due to medication non adherence and most likely dietary indiscretions - We discussed importance of glycemic control in preventing microvascular complications - I am going to restart insulin and ozempic  - she is intolerant to Metformin     MEDICATIONS: Start  Ozempic 0.25 mg once a week for 6 weeks, then increase to 0.5 mg after 6 weeks Start Tresiba 40  units once a day  Humalog 12 units with each meal    EDUCATION / INSTRUCTIONS: BG monitoring instructions: Patient is instructed to check her blood sugars 3 times a day, before meals . Call [facility] if: BG persistently < 70 I reviewed the Rule of 15 for the treatment of hypoglycemia in detail with the patient. Literature supplied.   2) Diabetic complications:  Eye: Does not have known diabetic retinopathy.  Neuro/ Feet: Does not have known diabetic peripheral neuropathy .  Renal: Patient does not have known baseline CKD. She   is  on an ACEI/ARB at present.   3) Dyslipidemia :   - LDL above goal , most likely due to non-compliance issues  - Will switch simvastatin 40 to atorvastatin 40 mg    Medication  Stop Simvastatin 40 mg daily  Start Atorvastatin 40 mg daily    4) HTN :  - Will refill ibesartan- HCTZ until she gets in with her PCP  F/U in 3 months     Signed electronically by: Mack Guise, MD  [facility]  [facility] [address] [place], [place] [address] Phone: [phone] FAX: [phone]   CC: Leeroy Cha, MD [address] [place] [address] Phone: [phone]  Fax: [phone]  Return to Endocrinology clinic as below: No future appointments.

## 2021-04-21 ENCOUNTER — Encounter: Payer: Self-pay | Admitting: Internal Medicine

## 2021-04-21 MED ORDER — ATORVASTATIN CALCIUM 40 MG PO TABS: 40.0000 mg | ORAL_TABLET | Freq: Every day | ORAL | 3 refills | Status: DC

## 2021-04-24 ENCOUNTER — Other Ambulatory Visit: Payer: Self-pay | Admitting: Internal Medicine

## 2021-04-24 MED ORDER — TOUJEO SOLOSTAR 300 UNIT/ML ~~LOC~~ SOPN: 40.0000 [IU] | PEN_INJECTOR | Freq: Every day | SUBCUTANEOUS | 3 refills | Status: DC

## 2021-04-24 NOTE — Progress Notes (Unsigned)
Will switch tresiba to [employer] based on insurance recommendations

## 2021-04-30 ENCOUNTER — Telehealth: Payer: Self-pay | Admitting: *Deleted

## 2021-04-30 NOTE — Telephone Encounter (Signed)
Attempted to reach the patient to reach a new patient appt, left a message to call the office back

## 2021-04-30 NOTE — Telephone Encounter (Signed)
Attempted to returned the patient's call, left a message to call the office back

## 2021-05-01 NOTE — Telephone Encounter (Signed)
Attempted to return patients call. Left message requesting return call to setup appointment.

## 2021-05-02 NOTE — Telephone Encounter (Signed)
Attempted to reach the patient to schedule a new patient appt, left a message to call the office back

## 2021-05-02 NOTE — Telephone Encounter (Signed)
Patient called back and was scheduled for a new patient appt for 9/13 with Dr Berline Lopes. Patient given the address and phone number for the clinic, along with the policy for mask and visitors

## 2021-05-05 ENCOUNTER — Telehealth: Payer: Self-pay | Admitting: Pharmacy Technician

## 2021-05-05 ENCOUNTER — Encounter: Payer: Self-pay | Admitting: Gynecologic Oncology

## 2021-05-05 DIAGNOSIS — G4730 Sleep apnea, unspecified: Secondary | ICD-10-CM | POA: Insufficient documentation

## 2021-05-05 NOTE — H&P (View-Only) (Signed)
GYNECOLOGIC ONCOLOGY NEW PATIENT CONSULTATION   Patient Name: Amy [name]  Patient Age: 55 y.o. Date of Service: 05/06/21 Referring Provider: Burman Riis, NP  Primary Care Provider: Leeroy Cha, MD Consulting Provider: Jeral Pinch, MD   Assessment/Plan:  Postmenopausal patient with enlarged globular uterus and infrequent episodes of bleeding with recent biopsy showing atypical cells with insufficient tissue for definitive diagnosis.  I discussed the biopsy result with the patient and her daughter, who accompanied her today.  Given scant atypical cells, further sampling was recommended by pathology.  We discussed that we do not have enough tissue to make a diagnosis of precancer or cancer.  I am recommending moving forward with more thorough evaluation and sampling.  I have recommended a D&C with possible hysteroscopy.  We discussed different types of precancer and cancer of the uterus.  I spent some time reviewing the more rare type of uterine cancer, clear-cell carcinoma.  We also discussed the most common type of uterine cancer (endometrioid) as well as its precursor lesion complex atypical hyperplasia.   Given her comorbidities, notably her poorly controlled diabetes and obesity, we would ideally avoid major surgery (hysterectomy) until sugars are better controlled.  We discussed the increased risk of perioperative complications related to hyperglycemia as well as the issues from a postoperative standpoint such as infection and healing.  Additionally, given the size of her uterus, I think there is a very good likelihood that she would need a mini lap incision for specimen removal, which would increase her risk related to wound healing and infection.  If the patient has a precancer or low-grade cancer that we expect to be responsive to progesterone therapy, my plan would be to use progesterone therapy in the form of an intrauterine device while we work on improving  glycemic control to be able to proceed with surgery in a safe manner.  I do not recommend placement of a Mirena IUD at the time of the patient's D&C because of the risk that if she has a cancer, based on what we are seeing on her recent endometrial biopsy, it may be a clear cell carcinoma.  We will tentatively plan for outpatient surgery on the 26th.  I discussed with the patient that there is a good chance we will call her at the end of this week or early next week to move her surgery date up.  We discussed that risks of surgery include but are not limited to bleeding, need for blood transfusion, uterine perforation, damage to surrounding structures requiring repair, medical complications (such as myocardial infarction, pneumonia, VTE, and rarely death).  Perioperative instructions were reviewed with the patient today.  She knows that I will call her for a phone visit once I have her final pathology to discuss neck steps.  A copy of this note was sent to the patient's referring provider.   65 minutes of total time was spent for this patient encounter, including preparation, face-to-face counseling with the patient and coordination of care, and documentation of the encounter.   Jeral Pinch, MD  Division of Gynecologic Oncology  Department of Obstetrics and Gynecology  [facility]  ___________________________________________  Chief Complaint: Chief Complaint  Patient presents with   Complex atypical endometrial hyperplasia    History of Present Illness:  Amy [name] is a 55 y.o. y.o. female who is seen in consultation at the request of Burman Riis NP for an evaluation of atypical cells on endometrial biopsy.  Patient reports regular menses  until 1 to 2 years ago.  Her menses then stopped for 9 months, she had an episode of bleeding like a menses, and then they stopped again for 11 months.  Most recently within the last month, she had a 3-5-day  episode of bleeding like a menses that she describes as light.  She was seen by her OB/GYN and underwent a biopsy.  She had light bleeding that has since stopped after the biopsy.  She denies any associated pain or cramping with these bleeding episodes or otherwise.  She notes having a history of heavy menses that she describes as regular for years.  On her heaviest days, she would saturate 6 super pads and tampons.  She notes passage of clots with her menses.  She endorses a good appetite without any nausea or emesis.  She reports regular bowel and bladder function.  She denies any symptoms of early satiety or bloating.  Her past medical history is notable for hypertension (on 2 medications), poorly controlled type 2 diabetes (is on insulin, blood glucoses ranged from 200-300, most recent hemoglobin A1c was 11.7%, see endocrinology and had a glucose monitor placed yesterday), obstructive sleep apnea with the use of a CPAP nightly, and obesity with a BMI of over 50.  Her family history is notable for ovarian cancer in a maternal aunt, 3-second maternal cousins who are all sisters have a history of breast cancer, and she has 2 paternal cousins with a history of stomach cancer.  She is unsure whether anyone has had genetic testing.  Patient lives alone in [place].  Her daughter lives approximately 30 minutes from her.  She works from home.  PAST MEDICAL HISTORY:  Past Medical History:  Diagnosis Date   Diabetes mellitus without complication ([place])    HLD (hyperlipidemia)    Hypertension    OSA (obstructive sleep apnea)    APAP S10 4-20 obt 2021   Sleep apnea      PAST SURGICAL HISTORY:  Past Surgical History:  Procedure Laterality Date   APPENDECTOMY  2003   [place]   COLONOSCOPY WITH PROPOFOL N/A 12/13/2017   Procedure: COLONOSCOPY WITH PROPOFOL;  Surgeon: Otis Brace, MD;  Location: [facility];  Service: Gastroenterology;  Laterality: N/A;   REDUCTION MAMMAPLASTY  Bilateral 2010    OB/GYN HISTORY:  OB History  Gravida Para Term Preterm AB Living  1 1          SAB IAB Ectopic Multiple Live Births               # Outcome Date GA Lbr Len/2nd Weight Sex Delivery Anes PTL Lv  1 Para             No LMP recorded. (Menstrual status: Perimenopausal).  Age at menarche: 26 Age at menopause: See HPI Hx of HRT: Denies Hx of STDs: Denies Last pap: 02/2021 - negative, HR HPV negative History of abnormal pap smears: no  SCREENING STUDIES:  Last mammogram: 05/2020  Last colonoscopy: 2019  MEDICATIONS: Outpatient Encounter Medications as of 05/06/2021  Medication Sig   aspirin EC 81 MG tablet Take 81 mg by mouth daily. Swallow whole.   atorvastatin (LIPITOR) 40 MG tablet Take 1 tablet (40 mg total) by mouth daily.   Cholecalciferol (VITAMIN D3) 25 MCG (1000 UT) CAPS Take 2 capsules by mouth daily.   ferrous sulfate 325 (65 FE) MG tablet Take 325 mg by mouth daily with breakfast.   insulin glargine, 1 Unit Dial, (TOUJEO SOLOSTAR) 300  UNIT/ML Solostar Pen Inject 40 Units into the skin daily in the afternoon.   insulin lispro (HUMALOG KWIKPEN) 100 UNIT/ML KwikPen Inject 12 Units into the skin 3 (three) times daily.   Insulin Pen Needle 31G X 5 MM MISC 1 Device by Does not apply route in the morning, at noon, in the evening, and at bedtime.   irbesartan-hydrochlorothiazide (AVALIDE) 300-12.5 MG tablet Take 1 tablet by mouth daily.   Semaglutide,0.25 or 0.'5MG'$ /DOS, (OZEMPIC, 0.25 OR 0.5 MG/DOSE,) 2 MG/1.5ML SOPN Inject 0.5 mg into the skin once a week.   No facility-administered encounter medications on file as of 05/06/2021.    ALLERGIES:  Allergies  Allergen Reactions   Lisinopril Swelling     FAMILY HISTORY:  Family History  Problem Relation Age of Onset   Aneurysm Father    Diabetes Brother    Ovarian cancer Maternal Aunt    Breast cancer Cousin    Breast cancer Cousin    Breast cancer Cousin    Colon cancer Cousin    Colon cancer Cousin     Prostate cancer Neg Hx    Pancreatic cancer Neg Hx    Cancer - Colon Neg Hx    Endometrial cancer Neg Hx      SOCIAL HISTORY:  Social Connections: Not on file    REVIEW OF SYSTEMS:  Denies appetite changes, fevers, chills, fatigue, unexplained weight changes. Denies hearing loss, neck lumps or masses, mouth sores, ringing in ears or voice changes. Denies cough or wheezing.  Denies shortness of breath. Denies chest pain or palpitations. Denies leg swelling. Denies abdominal distention, pain, blood in stools, constipation, diarrhea, nausea, vomiting, or early satiety. Denies pain with intercourse, dysuria, frequency, hematuria or incontinence. Denies hot flashes, pelvic pain, vaginal bleeding or vaginal discharge.   Denies joint pain, back pain or muscle pain/cramps. Denies itching, rash, or wounds. Denies dizziness, headaches, numbness or seizures. Denies swollen lymph nodes or glands, denies easy bruising or bleeding. Denies anxiety, depression, confusion, or decreased concentration.  Physical Exam:  Vital Signs for this encounter:  Blood pressure (!) 156/97, pulse (!) 113, temperature (!) 97.1 F (36.2 C), resp. rate (!) 22, weight (!) 329 lb (149.2 kg), SpO2 99 %. Body mass index is 53.1 kg/m. General: Alert, oriented, no acute distress.  HEENT: Normocephalic, atraumatic. Sclera anicteric.  Chest: Clear to auscultation bilaterally. No wheezes, rhonchi, or rales. Cardiovascular: Regular rate and rhythm, no murmurs, rubs, or gallops.  Abdomen: Obese. Normoactive bowel sounds. Soft, nondistended, nontender to palpation. No masses or hepatosplenomegaly appreciated. No palpable fluid wave.  Multiple well-healed incisions. Extremities: Grossly normal range of motion. Warm, well perfused. No edema bilaterally.  Skin: No rashes or lesions.  Lymphatics: No cervical, supraclavicular, or inguinal adenopathy.  GU:  Normal external female genitalia.  No lesions. No discharge or  bleeding.             Bladder/urethra:  No lesions or masses, well supported bladder             Vagina: Well rugated, no lesions or masses.             Cervix: Normal appearing, no lesions.             Uterus: Somewhat difficult exam secondary to body habitus.  Uterus is enlarged but mobile.  No parametrial or involvement or nodularity.  Small, mobile, no parametrial involvement or nodularity.             Adnexa: No masses appreciated.  LABORATORY AND  RADIOLOGIC DATA:  Outside medical records were reviewed to synthesize the above history, along with the history and physical obtained during the visit.   Lab Results  Component Value Date   GLUCOSE 251 (H) 04/18/2021   CHOL 207 (H) 04/18/2021   TRIG 117.0 04/18/2021   HDL 39.70 04/18/2021   LDLCALC 144 (H) 04/18/2021   NA 136 04/18/2021   K 3.6 04/18/2021   CL 98 04/18/2021   CREATININE 0.81 04/18/2021   BUN 15 04/18/2021   CO2 26 04/18/2021   TSH 2.08 04/18/2021   HGBA1C 11.7 (A) 04/18/2021   MICROALBUR 7.0 (H) 04/18/2021   Hemoglobin A1C 4.0 - 5.6 % 11.7 Abnormal     Pelvic ultrasound 8/10: Measurements: 15.4 x 9.0 x 10.1 cm = volume: 734.4 mL. Anteverted uterus. Uterus is somewhat enlarged with globular morphology and heterogeneous echotexture within the uterine myometrium, suggesting adenomyosis. No definite discrete fibroid. IMPRESSION: 1. Enlarged uterus with globular morphology and heterogeneous echotexture of the uterine myometrium, suggesting adenomyosis. No discrete fibroids. 2. Normal endometrium. 3. Nonvisualization of either ovary.  No adnexal mass or free fluid.  Endometrial biopsy from 8/30: Small focus of atypical appearing clear cell population is noted in the background of minute fragments of weakly proliferative endometrium and blood.  Comment that the clear cells of interest are scant and additional sampling is recommended for definitive evaluation and diagnosis.

## 2021-05-05 NOTE — Progress Notes (Signed)
GYNECOLOGIC ONCOLOGY NEW PATIENT CONSULTATION   Patient Name: Amy [name]  Patient Age: 55 y.o. Date of Service: 05/06/21 Referring Provider: Burman Riis, NP  Primary Care Provider: Leeroy Cha, MD Consulting Provider: Jeral Pinch, MD   Assessment/Plan:  Postmenopausal patient with enlarged globular uterus and infrequent episodes of bleeding with recent biopsy showing atypical cells with insufficient tissue for definitive diagnosis.  I discussed the biopsy result with the patient and her daughter, who accompanied her today.  Given scant atypical cells, further sampling was recommended by pathology.  We discussed that we do not have enough tissue to make a diagnosis of precancer or cancer.  I am recommending moving forward with more thorough evaluation and sampling.  I have recommended a D&C with possible hysteroscopy.  We discussed different types of precancer and cancer of the uterus.  I spent some time reviewing the more rare type of uterine cancer, clear-cell carcinoma.  We also discussed the most common type of uterine cancer (endometrioid) as well as its precursor lesion complex atypical hyperplasia.   Given her comorbidities, notably her poorly controlled diabetes and obesity, we would ideally avoid major surgery (hysterectomy) until sugars are better controlled.  We discussed the increased risk of perioperative complications related to hyperglycemia as well as the issues from a postoperative standpoint such as infection and healing.  Additionally, given the size of her uterus, I think there is a very good likelihood that she would need a mini lap incision for specimen removal, which would increase her risk related to wound healing and infection.  If the patient has a precancer or low-grade cancer that we expect to be responsive to progesterone therapy, my plan would be to use progesterone therapy in the form of an intrauterine device while we work on improving  glycemic control to be able to proceed with surgery in a safe manner.  I do not recommend placement of a Mirena IUD at the time of the patient's D&C because of the risk that if she has a cancer, based on what we are seeing on her recent endometrial biopsy, it may be a clear cell carcinoma.  We will tentatively plan for outpatient surgery on the 26th.  I discussed with the patient that there is a good chance we will call her at the end of this week or early next week to move her surgery date up.  We discussed that risks of surgery include but are not limited to bleeding, need for blood transfusion, uterine perforation, damage to surrounding structures requiring repair, medical complications (such as myocardial infarction, pneumonia, VTE, and rarely death).  Perioperative instructions were reviewed with the patient today.  She knows that I will call her for a phone visit once I have her final pathology to discuss neck steps.  A copy of this note was sent to the patient's referring provider.   65 minutes of total time was spent for this patient encounter, including preparation, face-to-face counseling with the patient and coordination of care, and documentation of the encounter.   Jeral Pinch, MD  Division of Gynecologic Oncology  Department of Obstetrics and Gynecology  [facility]  ___________________________________________  Chief Complaint: Chief Complaint  Patient presents with   Complex atypical endometrial hyperplasia    History of Present Illness:  Amy [name] is a 55 y.o. y.o. female who is seen in consultation at the request of Burman Riis NP for an evaluation of atypical cells on endometrial biopsy.  Patient reports regular menses  until 1 to 2 years ago.  Her menses then stopped for 9 months, she had an episode of bleeding like a menses, and then they stopped again for 11 months.  Most recently within the last month, she had a 3-5-day  episode of bleeding like a menses that she describes as light.  She was seen by her OB/GYN and underwent a biopsy.  She had light bleeding that has since stopped after the biopsy.  She denies any associated pain or cramping with these bleeding episodes or otherwise.  She notes having a history of heavy menses that she describes as regular for years.  On her heaviest days, she would saturate 6 super pads and tampons.  She notes passage of clots with her menses.  She endorses a good appetite without any nausea or emesis.  She reports regular bowel and bladder function.  She denies any symptoms of early satiety or bloating.  Her past medical history is notable for hypertension (on 2 medications), poorly controlled type 2 diabetes (is on insulin, blood glucoses ranged from 200-300, most recent hemoglobin A1c was 11.7%, see endocrinology and had a glucose monitor placed yesterday), obstructive sleep apnea with the use of a CPAP nightly, and obesity with a BMI of over 50.  Her family history is notable for ovarian cancer in a maternal aunt, 3-second maternal cousins who are all sisters have a history of breast cancer, and she has 2 paternal cousins with a history of stomach cancer.  She is unsure whether anyone has had genetic testing.  Patient lives alone in [place].  Her daughter lives approximately 30 minutes from her.  She works from home.  PAST MEDICAL HISTORY:  Past Medical History:  Diagnosis Date   Diabetes mellitus without complication ([place])    HLD (hyperlipidemia)    Hypertension    OSA (obstructive sleep apnea)    APAP S10 4-20 obt 2021   Sleep apnea      PAST SURGICAL HISTORY:  Past Surgical History:  Procedure Laterality Date   APPENDECTOMY  2003   [place]   COLONOSCOPY WITH PROPOFOL N/A 12/13/2017   Procedure: COLONOSCOPY WITH PROPOFOL;  Surgeon: Otis Brace, MD;  Location: [facility];  Service: Gastroenterology;  Laterality: N/A;   REDUCTION MAMMAPLASTY  Bilateral 2010    OB/GYN HISTORY:  OB History  Gravida Para Term Preterm AB Living  1 1          SAB IAB Ectopic Multiple Live Births               # Outcome Date GA Lbr Len/2nd Weight Sex Delivery Anes PTL Lv  1 Para             No LMP recorded. (Menstrual status: Perimenopausal).  Age at menarche: 59 Age at menopause: See HPI Hx of HRT: Denies Hx of STDs: Denies Last pap: 02/2021 - negative, HR HPV negative History of abnormal pap smears: no  SCREENING STUDIES:  Last mammogram: 05/2020  Last colonoscopy: 2019  MEDICATIONS: Outpatient Encounter Medications as of 05/06/2021  Medication Sig   aspirin EC 81 MG tablet Take 81 mg by mouth daily. Swallow whole.   atorvastatin (LIPITOR) 40 MG tablet Take 1 tablet (40 mg total) by mouth daily.   Cholecalciferol (VITAMIN D3) 25 MCG (1000 UT) CAPS Take 2 capsules by mouth daily.   ferrous sulfate 325 (65 FE) MG tablet Take 325 mg by mouth daily with breakfast.   insulin glargine, 1 Unit Dial, (TOUJEO SOLOSTAR) 300  UNIT/ML Solostar Pen Inject 40 Units into the skin daily in the afternoon.   insulin lispro (HUMALOG KWIKPEN) 100 UNIT/ML KwikPen Inject 12 Units into the skin 3 (three) times daily.   Insulin Pen Needle 31G X 5 MM MISC 1 Device by Does not apply route in the morning, at noon, in the evening, and at bedtime.   irbesartan-hydrochlorothiazide (AVALIDE) 300-12.5 MG tablet Take 1 tablet by mouth daily.   Semaglutide,0.25 or 0.'5MG'$ /DOS, (OZEMPIC, 0.25 OR 0.5 MG/DOSE,) 2 MG/1.5ML SOPN Inject 0.5 mg into the skin once a week.   No facility-administered encounter medications on file as of 05/06/2021.    ALLERGIES:  Allergies  Allergen Reactions   Lisinopril Swelling     FAMILY HISTORY:  Family History  Problem Relation Age of Onset   Aneurysm Father    Diabetes Brother    Ovarian cancer Maternal Aunt    Breast cancer Cousin    Breast cancer Cousin    Breast cancer Cousin    Colon cancer Cousin    Colon cancer Cousin     Prostate cancer Neg Hx    Pancreatic cancer Neg Hx    Cancer - Colon Neg Hx    Endometrial cancer Neg Hx      SOCIAL HISTORY:  Social Connections: Not on file    REVIEW OF SYSTEMS:  Denies appetite changes, fevers, chills, fatigue, unexplained weight changes. Denies hearing loss, neck lumps or masses, mouth sores, ringing in ears or voice changes. Denies cough or wheezing.  Denies shortness of breath. Denies chest pain or palpitations. Denies leg swelling. Denies abdominal distention, pain, blood in stools, constipation, diarrhea, nausea, vomiting, or early satiety. Denies pain with intercourse, dysuria, frequency, hematuria or incontinence. Denies hot flashes, pelvic pain, vaginal bleeding or vaginal discharge.   Denies joint pain, back pain or muscle pain/cramps. Denies itching, rash, or wounds. Denies dizziness, headaches, numbness or seizures. Denies swollen lymph nodes or glands, denies easy bruising or bleeding. Denies anxiety, depression, confusion, or decreased concentration.  Physical Exam:  Vital Signs for this encounter:  Blood pressure (!) 156/97, pulse (!) 113, temperature (!) 97.1 F (36.2 C), resp. rate (!) 22, weight (!) 329 lb (149.2 kg), SpO2 99 %. Body mass index is 53.1 kg/m. General: Alert, oriented, no acute distress.  HEENT: Normocephalic, atraumatic. Sclera anicteric.  Chest: Clear to auscultation bilaterally. No wheezes, rhonchi, or rales. Cardiovascular: Regular rate and rhythm, no murmurs, rubs, or gallops.  Abdomen: Obese. Normoactive bowel sounds. Soft, nondistended, nontender to palpation. No masses or hepatosplenomegaly appreciated. No palpable fluid wave.  Multiple well-healed incisions. Extremities: Grossly normal range of motion. Warm, well perfused. No edema bilaterally.  Skin: No rashes or lesions.  Lymphatics: No cervical, supraclavicular, or inguinal adenopathy.  GU:  Normal external female genitalia.  No lesions. No discharge or  bleeding.             Bladder/urethra:  No lesions or masses, well supported bladder             Vagina: Well rugated, no lesions or masses.             Cervix: Normal appearing, no lesions.             Uterus: Somewhat difficult exam secondary to body habitus.  Uterus is enlarged but mobile.  No parametrial or involvement or nodularity.  Small, mobile, no parametrial involvement or nodularity.             Adnexa: No masses appreciated.  LABORATORY AND  RADIOLOGIC DATA:  Outside medical records were reviewed to synthesize the above history, along with the history and physical obtained during the visit.   Lab Results  Component Value Date   GLUCOSE 251 (H) 04/18/2021   CHOL 207 (H) 04/18/2021   TRIG 117.0 04/18/2021   HDL 39.70 04/18/2021   LDLCALC 144 (H) 04/18/2021   NA 136 04/18/2021   K 3.6 04/18/2021   CL 98 04/18/2021   CREATININE 0.81 04/18/2021   BUN 15 04/18/2021   CO2 26 04/18/2021   TSH 2.08 04/18/2021   HGBA1C 11.7 (A) 04/18/2021   MICROALBUR 7.0 (H) 04/18/2021   Hemoglobin A1C 4.0 - 5.6 % 11.7 Abnormal     Pelvic ultrasound 8/10: Measurements: 15.4 x 9.0 x 10.1 cm = volume: 734.4 mL. Anteverted uterus. Uterus is somewhat enlarged with globular morphology and heterogeneous echotexture within the uterine myometrium, suggesting adenomyosis. No definite discrete fibroid. IMPRESSION: 1. Enlarged uterus with globular morphology and heterogeneous echotexture of the uterine myometrium, suggesting adenomyosis. No discrete fibroids. 2. Normal endometrium. 3. Nonvisualization of either ovary.  No adnexal mass or free fluid.  Endometrial biopsy from 8/30: Small focus of atypical appearing clear cell population is noted in the background of minute fragments of weakly proliferative endometrium and blood.  Comment that the clear cells of interest are scant and additional sampling is recommended for definitive evaluation and diagnosis.

## 2021-05-05 NOTE — Telephone Encounter (Addendum)
Patient Advocate Encounter   Received notification from [place] that prior authorization for TRESIBA is required.   PA submitted on 05/05/2021 Key [id number] Status is DENIED  Must try and fail Toujeo.  Patient was switched already.    [facility] will continue to follow   Ronney Asters, CPhT Patient Advocate [facility] Phone: [phone] Fax:  [phone]

## 2021-05-06 ENCOUNTER — Other Ambulatory Visit: Payer: Self-pay

## 2021-05-06 ENCOUNTER — Encounter: Payer: Self-pay | Admitting: Gynecologic Oncology

## 2021-05-06 ENCOUNTER — Encounter: Payer: 59 | Admitting: Gynecologic Oncology

## 2021-05-06 ENCOUNTER — Inpatient Hospital Stay (HOSPITAL_BASED_OUTPATIENT_CLINIC_OR_DEPARTMENT_OTHER): Payer: 59 | Admitting: Gynecologic Oncology

## 2021-05-06 ENCOUNTER — Inpatient Hospital Stay: Payer: 59 | Attending: Gynecologic Oncology | Admitting: Gynecologic Oncology

## 2021-05-06 ENCOUNTER — Other Ambulatory Visit: Payer: Self-pay | Admitting: Gynecologic Oncology

## 2021-05-06 VITALS — BP 156/97 | HR 113 | Temp 97.1°F | Resp 22 | Wt 329.0 lb

## 2021-05-06 DIAGNOSIS — N852 Hypertrophy of uterus: Secondary | ICD-10-CM

## 2021-05-06 DIAGNOSIS — N950 Postmenopausal bleeding: Secondary | ICD-10-CM | POA: Insufficient documentation

## 2021-05-06 DIAGNOSIS — I10 Essential (primary) hypertension: Secondary | ICD-10-CM | POA: Insufficient documentation

## 2021-05-06 DIAGNOSIS — N8502 Endometrial intraepithelial neoplasia [EIN]: Secondary | ICD-10-CM | POA: Diagnosis present

## 2021-05-06 DIAGNOSIS — Z794 Long term (current) use of insulin: Secondary | ICD-10-CM | POA: Insufficient documentation

## 2021-05-06 DIAGNOSIS — E1165 Type 2 diabetes mellitus with hyperglycemia: Secondary | ICD-10-CM | POA: Diagnosis not present

## 2021-05-06 DIAGNOSIS — Z6841 Body Mass Index (BMI) 40.0 and over, adult: Secondary | ICD-10-CM | POA: Diagnosis not present

## 2021-05-06 DIAGNOSIS — G4733 Obstructive sleep apnea (adult) (pediatric): Secondary | ICD-10-CM | POA: Diagnosis not present

## 2021-05-06 DIAGNOSIS — E669 Obesity, unspecified: Secondary | ICD-10-CM | POA: Insufficient documentation

## 2021-05-06 DIAGNOSIS — Z79899 Other long term (current) drug therapy: Secondary | ICD-10-CM | POA: Diagnosis not present

## 2021-05-06 HISTORY — DX: Postmenopausal bleeding: N95.0

## 2021-05-06 HISTORY — DX: Hypertrophy of uterus: N85.2

## 2021-05-06 NOTE — Patient Instructions (Signed)
Preparing for your Surgery  Plan for surgery on May 19, 2021 with Dr. Jeral Pinch at [facility] will be scheduled for a dilation and curettage of the uterus (dilating the cervix, scraping the lining of the uterus), possible hysteroscopy (looking with a camera).   Pre-operative Testing -You will receive a phone call from presurgical testing at [facility] to discuss surgery instructions and arrange for pre-op appointment/lab work if needed.  -Bring your insurance card, copy of an advanced directive if applicable, medication list.  -You can keep taking your aspirin 81 mg daily with the last dose the day before.  -Do not take supplements such as fish oil (omega 3), red yeast rice, turmeric before your surgery. You want to avoid medications with aspirin in them including headache powders such as BC or Goody's), Excedrin migraine.  Day Before Surgery at [facility] will be advised you can have clear liquids up until 3 hours before your surgery.    Your role in recovery Your role is to become active as soon as directed by your doctor, while still giving yourself time to heal.  Rest when you feel tired. You will be asked to do the following in order to speed your recovery:  - Cough and breathe deeply. This helps to clear and expand your lungs and can prevent pneumonia after surgery.  - [place]. Do mild physical activity. Walking or moving your legs help your circulation and body functions return to normal. Do not try to get up or walk alone the first time after surgery.   -If you develop swelling on one leg or the other, pain in the back of your leg, redness/warmth in one of your legs, please call the office or go to the Emergency Room to have a doppler to rule out a blood clot. For shortness of breath, chest pain-seek care in the Emergency Room as soon as possible. - Actively manage your pain. Managing your pain lets you move in comfort. We will  ask you to rate your pain on a scale of zero to 10. It is your responsibility to tell your doctor or nurse where and how much you hurt so your pain can be treated.  Special Considerations -Your final pathology results from surgery should be available around one week after surgery and the results will be relayed to you when available.  -FMLA forms can be faxed to [phone] and please allow 5-7 business days for completion.  Pain Management After Surgery -Make sure that you have Tylenol and Ibuprofen at home to use on a regular basis after surgery for pain control. We recommend alternating the medications every hour to six hours since they work differently and are processed in the body differently for pain relief.  -Review the attached handout on narcotic use and their risks and side effects.   Bowel Regimen - It is important to prevent constipation and drink adequate amounts of liquids.   Risks of Surgery Risks of surgery are low but include bleeding, infection, damage to surrounding structures, re-operation, blood clots, and very rarely death.  AFTER SURGERY INSTRUCTIONS  Return to work:  1-2 days if applicable  Activity: 1. Be up and out of the bed during the day.  Take a nap if needed.  You may walk up steps but be careful and use the hand rail.  Stair climbing will tire you more than you think, you may need to stop part way and rest.  2. No lifting or straining for 2 weeks over 10 pounds. No pushing, pulling, straining for 2 weeks.  3. No driving for minimum 24 hours after surgery.  Do not drive if you are taking narcotic pain medicine and make sure that your reaction time has returned.   4. You can shower as soon as the next day after surgery. Shower daily. No tub baths or submerging your body in water until cleared by your surgeon.   5. No sexual activity and nothing in the vagina for 4 weeks.  8. You may experience vaginal spotting and discharge after surgery.  The spotting  is normal but if you experience heavy bleeding, call our office.  9. Take Tylenol or ibuprofen first for pain.  Monitor your Tylenol intake to a max of 4,000 mg in a 24 hour period. You can alternate these medications after surgery.  Diet: 1. Low sodium Heart Healthy Diet is recommended but you are cleared to resume your normal (before surgery) diet after your procedure.  2. It is safe to use a laxative, such as Miralax or Colace, if you have difficulty moving your bowels.   Wound Care: 1. Keep clean and dry.  Shower daily.  Reasons to call the Doctor: Fever - Oral temperature greater than 100.4 degrees Fahrenheit Foul-smelling vaginal discharge Difficulty urinating Nausea and vomiting Difficulty breathing with or without chest pain New calf pain especially if only on one side Sudden, continuing increased vaginal bleeding with or without clots.   Contacts: For questions or concerns you should contact:  Dr. Jeral Pinch at [phone]  Joylene John, NP at [phone]  After Hours: call [phone] and have the GYN Oncologist paged/contacted (after 5 pm or on the weekends).  Messages sent via mychart are for non-urgent matters and are not responded to after hours so for urgent needs, please call the after hours number.

## 2021-05-06 NOTE — Progress Notes (Signed)
Pre-op gabapentin ordered 

## 2021-05-07 ENCOUNTER — Other Ambulatory Visit: Payer: Self-pay

## 2021-05-07 NOTE — Progress Notes (Signed)
Patient here with her daughter for new patient consultation with Dr. Jeral Pinch and for a pre-operative discussion prior to her scheduled surgery on May 19, 2021. She is scheduled for dilation and curettage of the uterus, possible hysteroscopy. The surgery was discussed in detail.  See after visit summary for additional details. Visual aids used to discuss items related to surgery including sequential compression stockings, IV pump, multi-modal pain regimen including tylenol and ibuprofen, female reproductive system to discuss surgery in detail.      Discussed post-op pain management in detail including the aspects of the enhanced recovery pathway. We discussed the use of tylenol post-op and to monitor for a maximum of 4,000 mg in a 24 hour period. Discussed bowel regimen in detail.     Discussed the use of SCDs and measures to take at home to prevent DVT including frequent mobility.  Reportable signs and symptoms of DVT discussed. Post-operative instructions discussed and expectations for after surgery.     10 minutes spent with the patient.  Verbalizing understanding of material discussed. No needs or concerns voiced at the end of the visit.   Advised patient and family to call for any needs.    This appointment is included in the global surgical bundle as pre-operative teaching and has no charge.

## 2021-05-12 ENCOUNTER — Telehealth: Payer: Self-pay | Admitting: *Deleted

## 2021-05-12 NOTE — Telephone Encounter (Signed)
Called and moved the patient's appt from 10/28 to 10/26

## 2021-05-13 ENCOUNTER — Telehealth: Payer: Self-pay

## 2021-05-13 NOTE — Telephone Encounter (Signed)
Spoke with Amy [name] to see if she would like to move her surgery to this Thursday 05/15/21. Patient states there are a lot of moving parts and it would be easier to keep my surgery on Monday 05/19/21.

## 2021-05-14 NOTE — Progress Notes (Addendum)
COVID swab appointment: N/a  COVID Vaccine Completed: yes x2 Date COVID Vaccine completed: Has received booster: COVID vaccine manufacturer: Pfizer       Date of COVID positive in last 90 days: No  PCP - Leeroy Cha, MD Cardiologist - N/a  Chest x-ray - N/a EKG - 05/15/21 Epic Stress Test - N/a ECHO - N/a Cardiac Cath - N/a Pacemaker/ICD device last checked: N/a Spinal Cord Stimulator: N/a  Sleep Study - yes positive  CPAP - everynight  Fasting Blood Sugar - 80-160 Checks Blood Sugar- constant, has continuous blood sugar monitor Dexicom blood sugar monitor  Blood Thinner Instructions: Aspirin Instructions: ASA 81, no instructions given Last Dose:  Activity level: Can go up a flight of stairs and perform activities of daily living without stopping and without symptoms of chest pain or shortness of breath.    Anesthesia review: DM, HTN, sleep, apnea, A1C 10.8  Patient denies shortness of breath, fever, cough and chest pain at PAT appointment   Patient verbalized understanding of instructions that were given to them at the PAT appointment. Patient was also instructed that they will need to review over the PAT instructions again at home before surgery.

## 2021-05-14 NOTE — Patient Instructions (Addendum)
DUE TO COVID-19 ONLY ONE VISITOR IS ALLOWED TO COME WITH YOU AND STAY IN THE WAITING ROOM ONLY DURING PRE OP AND PROCEDURE.   **NO VISITORS ARE ALLOWED IN THE SHORT STAY AREA OR RECOVERY ROOM!!**       Your procedure is scheduled on: 05/19/21   Report to [facility] Main Entrance   Report to Short Stay at 5:45 AM   [facility])   Call this number if you have problems the morning of surgery [phone]   Do not eat food :After Midnight.   May have liquids until  5:00 AM day of surgery  CLEAR LIQUID DIET  Foods Allowed                                                                     Foods Excluded  Water, Black Coffee and tea (no milk or creamer)           liquids that you cannot  Plain Jell-O in any flavor  (No red)                                    see through such as: Fruit ices (not with fruit pulp)                                            milk, soups, orange juice              Iced Popsicles (No red)                                                All solid food                                   Apple juices Sports drinks like Gatorade (No red) Lightly seasoned clear broth or consume(fat free) Sugar    Oral Hygiene is also important to reduce your risk of infection.                                    Remember - BRUSH YOUR TEETH THE MORNING OF SURGERY WITH YOUR REGULAR TOOTHPASTE   Take these medicines the morning of surgery with A SIP OF WATER: Atorvastatin   DO NOT TAKE ANY ORAL DIABETIC MEDICATIONS DAY OF YOUR SURGERY  How to Manage Your Diabetes Before and After Surgery  Why is it important to control my blood sugar before and after surgery? Improving blood sugar levels before and after surgery helps healing and can limit problems. A way of improving blood sugar control is eating a healthy diet by:  Eating less sugar and carbohydrates  Increasing activity/exercise  Talking with your doctor about reaching your blood sugar goals High blood sugars  (greater than 180 mg/dL) can raise your risk of infections  and slow your recovery, so you will need to focus on controlling your diabetes during the weeks before surgery. Make sure that the doctor who takes care of your diabetes knows about your planned surgery including the date and location.  How do I manage my blood sugar before surgery? Check your blood sugar at least 4 times a day, starting 2 days before surgery, to make sure that the level is not too high or low. Check your blood sugar the morning of your surgery when you wake up and every 2 hours until you get to the Short Stay unit. If your blood sugar is less than 70 mg/dL, you will need to treat for low blood sugar: Do not take insulin. Treat a low blood sugar (less than 70 mg/dL) with  cup of clear juice (cranberry or apple), 4 glucose tablets, OR glucose gel. Recheck blood sugar in 15 minutes after treatment (to make sure it is greater than 70 mg/dL). If your blood sugar is not greater than 70 mg/dL on recheck, call [phone] for further instructions. Report your blood sugar to the short stay nurse when you get to Short Stay.  If you are admitted to the hospital after surgery: Your blood sugar will be checked by the staff and you will probably be given insulin after surgery (instead of oral diabetes medicines) to make sure you have good blood sugar levels. The goal for blood sugar control after surgery is 80-180 mg/dL.   WHAT DO I DO ABOUT MY DIABETES MEDICATION?  Do not take oral diabetes medicines (pills) the morning of surgery.  THE DAY BEFORE SURGERY, take Humalog as normal before meals, no bedtime dose. Take 50% (20 units) of insulin Glargine in the afternoon.   THE MORNING OF SURGERY, do not take insulin glargine. Do not take Humalog unless blood sugar is greater than 220, then take half of normal dose.  If your CBG is greater than 220 mg/dL, you may take  of your sliding scale  (correction) dose of  insulin.  Reviewed and Endorsed by [facility] Patient Education Committee, August 2015                               You may not have any metal on your body including hair pins, jewelry, and body piercing             Do not wear make-up, lotions, powders, perfumes, or deodorant  Do not wear nail polish including gel and S&S, artificial/acrylic nails, or any other type of covering on natural nails including finger and toenails. If you have artificial nails, gel coating, etc. that needs to be removed by a nail salon please have this removed prior to surgery or surgery may need to be canceled/ delayed if the surgeon/ anesthesia feels like they are unable to be safely monitored.   Do not shave  48 hours prior to surgery.    Do not bring valuables to the hospital. [place].   Bring CPAP mask and tubing day of surgery.   Ask your primary care provider for instructions regarding Aspirin before surgery.    Patients discharged on the day of surgery will not be allowed to drive home.  Special Instructions: Bring a copy of your healthcare power of attorney and living will documents  the day of surgery if you haven't scanned them before.   Please read over the following fact sheets you were given: IF YOU HAVE QUESTIONS ABOUT YOUR PRE-OP INSTRUCTIONS PLEASE CALL [place] - Preparing for Surgery Before surgery, you can play an important role.  Because skin is not sterile, your skin needs to be as free of germs as possible.  You can reduce the number of germs on your skin by washing with CHG (chlorahexidine gluconate) soap before surgery.  CHG is an antiseptic cleaner which kills germs and bonds with the skin to continue killing germs even after washing. Please DO NOT use if you have an allergy to CHG or antibacterial soaps.  If your skin becomes reddened/irritated stop using the CHG and inform your nurse when you arrive  at Short Stay. Do not shave (including legs and underarms) for at least 48 hours prior to the first CHG shower.  You may shave your face/neck.  Please follow these instructions carefully:  1.  Shower with CHG Soap the night before surgery and the  morning of surgery.  2.  If you choose to wash your hair, wash your hair first as usual with your normal  shampoo.  3.  After you shampoo, rinse your hair and body thoroughly to remove the shampoo.                             4.  Use CHG as you would any other liquid soap.  You can apply chg directly to the skin and wash.  Gently with a scrungie or clean washcloth.  5.  Apply the CHG Soap to your body ONLY FROM THE NECK DOWN.   Do   not use on face/ open                           Wound or open sores. Avoid contact with eyes, ears mouth and   genitals (private parts).                       Wash face,  Genitals (private parts) with your normal soap.             6.  Wash thoroughly, paying special attention to the area where your    surgery  will be performed.  7.  Thoroughly rinse your body with warm water from the neck down.  8.  DO NOT shower/wash with your normal soap after using and rinsing off the CHG Soap.                9.  Pat yourself dry with a clean towel.            10.  Wear clean pajamas.            11.  Place clean sheets on your bed the night of your first shower and do not  sleep with pets. Day of Surgery : Do not apply any lotions/deodorants the morning of surgery.  Please wear clean clothes to the hospital/surgery center.  FAILURE TO FOLLOW THESE INSTRUCTIONS MAY RESULT IN THE CANCELLATION OF YOUR SURGERY  PATIENT SIGNATURE_________________________________  NURSE SIGNATURE__________________________________  ________________________________________________________________________

## 2021-05-15 ENCOUNTER — Encounter (HOSPITAL_COMMUNITY)
Admission: RE | Admit: 2021-05-15 | Discharge: 2021-05-15 | Disposition: A | Discharge status: Home / Self Care | Payer: 59 | Source: Ambulatory Visit | Attending: Gynecologic Oncology | Admitting: Gynecologic Oncology

## 2021-05-15 ENCOUNTER — Encounter (HOSPITAL_COMMUNITY): Payer: Self-pay

## 2021-05-15 DIAGNOSIS — Z01818 Encounter for other preprocedural examination: Secondary | ICD-10-CM | POA: Insufficient documentation

## 2021-05-15 DIAGNOSIS — Z79899 Other long term (current) drug therapy: Secondary | ICD-10-CM | POA: Diagnosis not present

## 2021-05-15 DIAGNOSIS — Z794 Long term (current) use of insulin: Secondary | ICD-10-CM | POA: Diagnosis not present

## 2021-05-15 DIAGNOSIS — G4733 Obstructive sleep apnea (adult) (pediatric): Secondary | ICD-10-CM | POA: Insufficient documentation

## 2021-05-15 DIAGNOSIS — Z7982 Long term (current) use of aspirin: Secondary | ICD-10-CM | POA: Insufficient documentation

## 2021-05-15 DIAGNOSIS — N950 Postmenopausal bleeding: Secondary | ICD-10-CM | POA: Insufficient documentation

## 2021-05-15 DIAGNOSIS — E119 Type 2 diabetes mellitus without complications: Secondary | ICD-10-CM | POA: Diagnosis not present

## 2021-05-15 DIAGNOSIS — I10 Essential (primary) hypertension: Secondary | ICD-10-CM | POA: Diagnosis not present

## 2021-05-15 HISTORY — DX: Anemia, unspecified: D64.9

## 2021-05-15 LAB — COMPREHENSIVE METABOLIC PANEL
ALT: 13 U/L (ref 0–44)
AST: 17 U/L (ref 15–41)
Albumin: 3.5 g/dL (ref 3.5–5.0)
Alkaline Phosphatase: 65 U/L (ref 38–126)
Anion gap: 11 (ref 5–15)
BUN: 17 mg/dL (ref 6–20)
CO2: 27 mmol/L (ref 22–32)
Calcium: 9.2 mg/dL (ref 8.9–10.3)
Chloride: 103 mmol/L (ref 98–111)
Creatinine, Ser: 0.74 mg/dL (ref 0.44–1.00)
GFR, Estimated: >60 mL/min (ref >60)
Glucose, Bld: 103 mg/dL — ABNORMAL HIGH (ref 70–99)
Potassium: 3.8 mmol/L (ref 3.5–5.1)
Sodium: 141 mmol/L (ref 135–145)
Total Bilirubin: 0.4 mg/dL (ref 0.3–1.2)
Total Protein: 8.3 g/dL — ABNORMAL HIGH (ref 6.5–8.1)

## 2021-05-15 LAB — CBC
HCT: 39.9 % (ref 36.0–46.0)
Hemoglobin: 12.7 g/dL (ref 12.0–15.0)
MCH: 26.6 pg (ref 26.0–34.0)
MCHC: 31.8 g/dL (ref 30.0–36.0)
MCV: 83.6 fL (ref 80.0–100.0)
Platelets: 264 10*3/uL (ref 150–400)
RBC: 4.77 MIL/uL (ref 3.87–5.11)
RDW: 13.4 % (ref 11.5–15.5)
WBC: 8.4 10*3/uL (ref 4.0–10.5)
nRBC: 0 % (ref 0.0–0.2)

## 2021-05-15 LAB — GLUCOSE, CAPILLARY: Glucose-Capillary: 118 mg/dL — ABNORMAL HIGH (ref 70–99)

## 2021-05-16 ENCOUNTER — Telehealth: Payer: Self-pay

## 2021-05-16 LAB — HEMOGLOBIN A1C
Hgb A1c MFr Bld: 10.8 % — ABNORMAL HIGH (ref 4.8–5.6)
Mean Plasma Glucose: 263 mg/dL

## 2021-05-16 NOTE — Telephone Encounter (Signed)
Telephone call to check on pre-operative status.  Patient compliant with pre-operative instructions.  Reinforced NPO after midnight.  Patient inquiring how her pre-op labs were. Reviewed results with patient and informed A1C is elevated at 10.8. Reinforced eating a healthy diet to keep blood sugars in control.  Instructed to call for any needs.

## 2021-05-16 NOTE — Progress Notes (Signed)
A1C 10.8. Results routed to Dr. Berline Lopes.

## 2021-05-16 NOTE — Progress Notes (Signed)
Anesthesia Chart Review   Case: [id number] Date/Time: 05/19/21 0745   Procedure: DILATATION AND CURETTAGE, POSSIBLE HYSTEROSCOPY WITH MYOSURE   Anesthesia type: General   Pre-op diagnosis: POST MENOPAUSAL BLEEDING, ABNORMAL ENDOMETRIAL BIOPSY   Location: [facility] ROOM 02 / [facility]   Surgeons: Lafonda Mosses, MD       DISCUSSION:55 y.o. never smoker with h/o HTN, OSA, DM II, anemia, post menopausal bleeding scheduled for above procedure 05/19/2021 with Dr. Jeral Pinch.   A1C 10.8, this is down from 11.7 04/18/2021. Forwarded to Dr. Berline Lopes.  Last seen by endocrinology 04/18/2021, insulin and ozempic restarted at that visit.  Evaluate blood glucose DOS.  VS: BP (!) 142/76 Comment: manual  Pulse 95   Temp 37.3 C (Oral)   Resp 18   Ht 5\' 6"  (1.676 m)   Wt (!) 149 kg   SpO2 96%   BMI 53.01 kg/m   PROVIDERS: Leeroy Cha, MD is PCP    LABS:  forwarded to surgeon (all labs ordered are listed, but only abnormal results are displayed)  Labs Reviewed  COMPREHENSIVE METABOLIC PANEL - Abnormal; Notable for the following components:      Result Value   Glucose, Bld 103 (*)    Total Protein 8.3 (*)    All other components within normal limits  HEMOGLOBIN A1C - Abnormal; Notable for the following components:   Hgb A1c MFr Bld 10.8 (*)    All other components within normal limits  GLUCOSE, CAPILLARY - Abnormal; Notable for the following components:   Glucose-Capillary 118 (*)    All other components within normal limits  CBC     IMAGES:   EKG: 05/15/21 Rate 88 bpm  Normal sinus rhythm Minimal voltage criteria for LVH, may be normal variant ( Cornell product ) Cannot rule out Anterior infarct , age undetermined Abnormal ECG  CV:  Past Medical History:  Diagnosis Date   Anemia    Diabetes mellitus without complication (HCC)    HLD (hyperlipidemia)    Hypertension    OSA (obstructive sleep apnea)    APAP S10 4-20 obt 2021   Sleep apnea     Past Surgical  History:  Procedure Laterality Date   APPENDECTOMY  2003   CESAREAN SECTION  1995   COLONOSCOPY WITH PROPOFOL N/A 12/13/2017   Procedure: COLONOSCOPY WITH PROPOFOL;  Surgeon: Otis Brace, MD;  Location: [facility];  Service: Gastroenterology;  Laterality: N/A;   REDUCTION MAMMAPLASTY Bilateral 2010    MEDICATIONS:  aspirin EC 81 MG tablet   atorvastatin (LIPITOR) 40 MG tablet   Cholecalciferol (VITAMIN D3) 25 MCG (1000 UT) CAPS   insulin glargine, 1 Unit Dial, (TOUJEO SOLOSTAR) 300 UNIT/ML Solostar Pen   insulin lispro (HUMALOG KWIKPEN) 100 UNIT/ML KwikPen   Insulin Pen Needle 31G X 5 MM MISC   irbesartan-hydrochlorothiazide (AVALIDE) 300-12.5 MG tablet   Semaglutide,0.25 or 0.5MG /DOS, (OZEMPIC, 0.25 OR 0.5 MG/DOSE,) 2 MG/1.5ML SOPN   No current facility-administered medications for this encounter.    Amy Felix Ward, PA-C [facility] [phone]

## 2021-05-16 NOTE — Anesthesia Preprocedure Evaluation (Addendum)
Anesthesia Evaluation  Patient identified by MRN, date of birth, ID band Patient awake    Reviewed: Allergy & Precautions, NPO status , Patient's Chart, lab work & pertinent test results  Airway Mallampati: III  TM Distance: >3 FB Neck ROM: Full    Dental no notable dental hx.    Pulmonary sleep apnea ,    Pulmonary exam normal breath sounds clear to auscultation + decreased breath sounds      Cardiovascular hypertension, Pt. on medications Normal cardiovascular exam Rhythm:Regular Rate:Normal     Neuro/Psych negative neurological ROS  negative psych ROS   GI/Hepatic negative GI ROS, Neg liver ROS,   Endo/Other  diabetesMorbid obesity  Renal/GU negative Renal ROS  negative genitourinary   Musculoskeletal negative musculoskeletal ROS (+)   Abdominal (+) + obese,   Peds negative pediatric ROS (+)  Hematology negative hematology ROS (+)   Anesthesia Other Findings   Reproductive/Obstetrics negative OB ROS                            Anesthesia Physical Anesthesia Plan  ASA: 3  Anesthesia Plan: General   Post-op Pain Management:    Induction: Intravenous  PONV Risk Score and Plan: 3 and Ondansetron, Dexamethasone and Treatment may vary due to age or medical condition  Airway Management Planned: Oral ETT  Additional Equipment:   Intra-op Plan:   Post-operative Plan: Extubation in OR  Informed Consent: I have reviewed the patients History and Physical, chart, labs and discussed the procedure including the risks, benefits and alternatives for the proposed anesthesia with the patient or authorized representative who has indicated his/her understanding and acceptance.     Dental advisory given  Plan Discussed with: CRNA and Surgeon  Anesthesia Plan Comments: (See PAT note 05/15/21, Konrad Felix Ward, PA-C)       Anesthesia Quick Evaluation

## 2021-05-19 ENCOUNTER — Encounter (HOSPITAL_COMMUNITY)
Admission: RE | Disposition: A | Discharge status: Home / Self Care | Payer: Self-pay | Source: Home / Self Care | Attending: Gynecologic Oncology

## 2021-05-19 ENCOUNTER — Ambulatory Visit (HOSPITAL_COMMUNITY)
Admission: RE | Admit: 2021-05-19 | Discharge: 2021-05-19 | Disposition: A | Discharge status: Home / Self Care | Payer: 59 | Attending: Gynecologic Oncology | Admitting: Gynecologic Oncology

## 2021-05-19 ENCOUNTER — Encounter (HOSPITAL_COMMUNITY): Payer: Self-pay | Admitting: Gynecologic Oncology

## 2021-05-19 ENCOUNTER — Ambulatory Visit (HOSPITAL_COMMUNITY): Payer: 59 | Admitting: Physician Assistant

## 2021-05-19 ENCOUNTER — Ambulatory Visit (HOSPITAL_COMMUNITY): Payer: 59 | Admitting: Certified Registered Nurse Anesthetist

## 2021-05-19 DIAGNOSIS — Z794 Long term (current) use of insulin: Secondary | ICD-10-CM | POA: Diagnosis not present

## 2021-05-19 DIAGNOSIS — I10 Essential (primary) hypertension: Secondary | ICD-10-CM | POA: Insufficient documentation

## 2021-05-19 DIAGNOSIS — Z8041 Family history of malignant neoplasm of ovary: Secondary | ICD-10-CM | POA: Diagnosis not present

## 2021-05-19 DIAGNOSIS — N840 Polyp of corpus uteri: Secondary | ICD-10-CM | POA: Diagnosis not present

## 2021-05-19 DIAGNOSIS — E1165 Type 2 diabetes mellitus with hyperglycemia: Secondary | ICD-10-CM | POA: Diagnosis not present

## 2021-05-19 DIAGNOSIS — Z6841 Body Mass Index (BMI) 40.0 and over, adult: Secondary | ICD-10-CM | POA: Diagnosis not present

## 2021-05-19 DIAGNOSIS — Z888 Allergy status to other drugs, medicaments and biological substances status: Secondary | ICD-10-CM | POA: Diagnosis not present

## 2021-05-19 DIAGNOSIS — E669 Obesity, unspecified: Secondary | ICD-10-CM | POA: Insufficient documentation

## 2021-05-19 DIAGNOSIS — Z803 Family history of malignant neoplasm of breast: Secondary | ICD-10-CM | POA: Diagnosis not present

## 2021-05-19 DIAGNOSIS — Z79899 Other long term (current) drug therapy: Secondary | ICD-10-CM | POA: Insufficient documentation

## 2021-05-19 DIAGNOSIS — Z9049 Acquired absence of other specified parts of digestive tract: Secondary | ICD-10-CM | POA: Diagnosis not present

## 2021-05-19 DIAGNOSIS — N950 Postmenopausal bleeding: Secondary | ICD-10-CM | POA: Diagnosis not present

## 2021-05-19 DIAGNOSIS — Z7982 Long term (current) use of aspirin: Secondary | ICD-10-CM | POA: Diagnosis not present

## 2021-05-19 DIAGNOSIS — N852 Hypertrophy of uterus: Secondary | ICD-10-CM

## 2021-05-19 HISTORY — PX: HYSTEROSCOPY WITH D & C: SHX1775

## 2021-05-19 LAB — GLUCOSE, CAPILLARY
Glucose-Capillary: 107 mg/dL — ABNORMAL HIGH (ref 70–99)
Glucose-Capillary: 120 mg/dL — ABNORMAL HIGH (ref 70–99)

## 2021-05-19 SURGERY — DILATATION AND CURETTAGE /HYSTEROSCOPY
Anesthesia: General

## 2021-05-19 MED ORDER — DEXAMETHASONE SODIUM PHOSPHATE 10 MG/ML IJ SOLN
INTRAMUSCULAR | Status: DC | PRN
  Administered 2021-05-19: 5 mg via INTRAVENOUS

## 2021-05-19 MED ORDER — LIDOCAINE HCL (PF) 1 % IJ SOLN
INTRAMUSCULAR | Status: AC
  Filled 2021-05-19: qty 30

## 2021-05-19 MED ORDER — ONDANSETRON HCL 4 MG/2ML IJ SOLN
INTRAMUSCULAR | Status: DC | PRN
  Administered 2021-05-19: 4 mg via INTRAVENOUS

## 2021-05-19 MED ORDER — ONDANSETRON HCL 4 MG/2ML IJ SOLN: 4.0000 mg | Freq: Once | INTRAMUSCULAR | Status: DC | PRN

## 2021-05-19 MED ORDER — ROCURONIUM BROMIDE 10 MG/ML (PF) SYRINGE
PREFILLED_SYRINGE | INTRAVENOUS | Status: AC
  Filled 2021-05-19: qty 10

## 2021-05-19 MED ORDER — LIDOCAINE 2% (20 MG/ML) 5 ML SYRINGE
INTRAMUSCULAR | Status: DC | PRN
  Administered 2021-05-19: 100 mg via INTRAVENOUS

## 2021-05-19 MED ORDER — OXYCODONE HCL 5 MG PO TABS
5.0000 mg | ORAL_TABLET | Freq: Once | ORAL | Status: DC | PRN
Start: 2021-05-19 — End: 2021-05-19

## 2021-05-19 MED ORDER — SUCCINYLCHOLINE CHLORIDE 200 MG/10ML IV SOSY
PREFILLED_SYRINGE | INTRAVENOUS | Status: DC | PRN
  Administered 2021-05-19: 180 mg via INTRAVENOUS

## 2021-05-19 MED ORDER — DEXAMETHASONE SODIUM PHOSPHATE 4 MG/ML IJ SOLN: 4.0000 mg | INTRAMUSCULAR | Status: DC

## 2021-05-19 MED ORDER — LIDOCAINE HCL 1 % IJ SOLN
INTRAMUSCULAR | Status: DC | PRN
  Administered 2021-05-19: 10 mL

## 2021-05-19 MED ORDER — LACTATED RINGERS IV SOLN: INTRAVENOUS | Status: DC

## 2021-05-19 MED ORDER — LIDOCAINE HCL (PF) 2 % IJ SOLN
INTRAMUSCULAR | Status: AC
  Filled 2021-05-19: qty 5

## 2021-05-19 MED ORDER — PROPOFOL 10 MG/ML IV BOLUS
INTRAVENOUS | Status: AC
  Filled 2021-05-19: qty 20

## 2021-05-19 MED ORDER — PROPOFOL 10 MG/ML IV BOLUS
INTRAVENOUS | Status: AC
  Filled 2021-05-19: qty 40

## 2021-05-19 MED ORDER — GABAPENTIN 100 MG PO CAPS
200.0000 mg | ORAL_CAPSULE | ORAL | Status: AC
Start: 2021-05-19 — End: 2021-05-19
  Administered 2021-05-19: 200 mg via ORAL
  Filled 2021-05-19: qty 2

## 2021-05-19 MED ORDER — SCOPOLAMINE 1 MG/3DAYS TD PT72
1.0000 | MEDICATED_PATCH | TRANSDERMAL | Status: DC
  Administered 2021-05-19: 1.5 mg via TRANSDERMAL
  Filled 2021-05-19: qty 1

## 2021-05-19 MED ORDER — FENTANYL CITRATE PF 50 MCG/ML IJ SOSY: 25.0000 ug | PREFILLED_SYRINGE | INTRAMUSCULAR | Status: DC | PRN

## 2021-05-19 MED ORDER — FENTANYL CITRATE (PF) 100 MCG/2ML IJ SOLN
INTRAMUSCULAR | Status: DC | PRN
  Administered 2021-05-19 (×2): 50 ug via INTRAVENOUS

## 2021-05-19 MED ORDER — MIDAZOLAM HCL 5 MG/5ML IJ SOLN
INTRAMUSCULAR | Status: DC | PRN
  Administered 2021-05-19: 2 mg via INTRAVENOUS

## 2021-05-19 MED ORDER — CHLORHEXIDINE GLUCONATE 0.12 % MT SOLN
15.0000 mL | Freq: Once | OROMUCOSAL | Status: AC
  Administered 2021-05-19: 15 mL via OROMUCOSAL

## 2021-05-19 MED ORDER — ONDANSETRON HCL 4 MG/2ML IJ SOLN
INTRAMUSCULAR | Status: AC
  Filled 2021-05-19: qty 2

## 2021-05-19 MED ORDER — ORAL CARE MOUTH RINSE: 15.0000 mL | Freq: Once | OROMUCOSAL | Status: AC

## 2021-05-19 MED ORDER — OXYCODONE HCL 5 MG/5ML PO SOLN
5.0000 mg | Freq: Once | ORAL | Status: DC | PRN
Start: 2021-05-19 — End: 2021-05-19

## 2021-05-19 MED ORDER — FENTANYL CITRATE (PF) 100 MCG/2ML IJ SOLN
INTRAMUSCULAR | Status: AC
  Filled 2021-05-19: qty 2

## 2021-05-19 MED ORDER — PROPOFOL 10 MG/ML IV BOLUS
INTRAVENOUS | Status: DC | PRN
  Administered 2021-05-19: 200 mg via INTRAVENOUS

## 2021-05-19 MED ORDER — OXYCODONE HCL 5 MG PO TABS: 5.0000 mg | ORAL_TABLET | Freq: Once | ORAL | Status: DC | PRN

## 2021-05-19 MED ORDER — OXYCODONE HCL 5 MG/5ML PO SOLN: 5.0000 mg | Freq: Once | ORAL | Status: DC | PRN

## 2021-05-19 MED ORDER — ACETAMINOPHEN 500 MG PO TABS
1000.0000 mg | ORAL_TABLET | ORAL | Status: AC
  Administered 2021-05-19: 1000 mg via ORAL
  Filled 2021-05-19: qty 2

## 2021-05-19 MED ORDER — DEXAMETHASONE SODIUM PHOSPHATE 10 MG/ML IJ SOLN
INTRAMUSCULAR | Status: AC
  Filled 2021-05-19: qty 1

## 2021-05-19 MED ORDER — SODIUM CHLORIDE 0.9 % IR SOLN
Status: DC | PRN
  Administered 2021-05-19: 3000 mL

## 2021-05-19 MED ORDER — CELECOXIB 200 MG PO CAPS
200.0000 mg | ORAL_CAPSULE | ORAL | Status: AC
  Administered 2021-05-19: 200 mg via ORAL
  Filled 2021-05-19: qty 1

## 2021-05-19 MED ORDER — MIDAZOLAM HCL 2 MG/2ML IJ SOLN
INTRAMUSCULAR | Status: AC
  Filled 2021-05-19: qty 2

## 2021-05-19 MED ORDER — PROMETHAZINE HCL 25 MG/ML IJ SOLN: 6.2500 mg | INTRAMUSCULAR | Status: DC | PRN

## 2021-05-19 SURGICAL SUPPLY — 21 items
BAG COUNTER SPONGE SURGICOUNT (BAG) ×2 IMPLANT
BIPOLAR CUTTING LOOP 21FR (ELECTRODE)
CANISTER SUCT 3000ML PPV (MISCELLANEOUS) IMPLANT
CATH ROBINSON RED A/P 16FR (CATHETERS) IMPLANT
DEVICE MYOSURE LITE (MISCELLANEOUS) IMPLANT
DEVICE MYOSURE REACH (MISCELLANEOUS) ×2 IMPLANT
DILATOR CANAL MILEX (MISCELLANEOUS) IMPLANT
GAUZE 4X4 16PLY ~~LOC~~+RFID DBL (SPONGE) ×2 IMPLANT
GLOVE SURG ENC MOIS LTX SZ6 (GLOVE) ×2 IMPLANT
GLOVE SURG ENC MOIS LTX SZ6.5 (GLOVE) IMPLANT
GOWN STRL REUS W/TWL LRG LVL3 (GOWN DISPOSABLE) ×4 IMPLANT
KIT PROCEDURE FLUENT (KITS) ×2 IMPLANT
KIT TURNOVER KIT A (KITS) ×2 IMPLANT
LOOP CUTTING BIPOLAR 21FR (ELECTRODE) IMPLANT
MYOSURE XL FIBROID REM (MISCELLANEOUS)
PACK VAGINAL MINOR WOMEN LF (CUSTOM PROCEDURE TRAY) ×2 IMPLANT
PAD OB MATERNITY 4.3X12.25 (PERSONAL CARE ITEMS) ×2 IMPLANT
PAD PREP 24X48 CUFFED NSTRL (MISCELLANEOUS) ×2 IMPLANT
SEAL ROD LENS SCOPE MYOSURE (ABLATOR) ×2 IMPLANT
SYSTEM TISS REMOVAL MYSR XL RM (MISCELLANEOUS) IMPLANT
TOWEL OR 17X26 10 PK STRL BLUE (TOWEL DISPOSABLE) ×2 IMPLANT

## 2021-05-19 NOTE — Op Note (Signed)
OPERATIVE NOTE  PATIENT: Amy [name] DATE: 05/19/21  Preop Diagnosis: PMB, atypical cells on EMB  Postoperative Diagnosis: same as above, atrophic appearance of endometrium  Surgery: Hysteroscopy, D&C (dilation and curettage), sampling with the Myosure  Surgeons:  Valarie Cones MD  Assistant: none  Anesthesia: General   Estimated blood loss: 2ml  IVF:  see I&O flowsheet   Urine output: n/a  Fluid deficit: 58IF   Complications: None apparent  Pathology: endometrial curetteings  Operative findings: Enlarged 12-14cm mobile uterus. Sounded to just over 8 cm. On hysteroscopy, long atrophic cervix. Uterine cavity atrophic with two small (<63mm) polyps. Bilateral tubal ostia noted.  Procedure: The patient was identified in the preoperative holding area. Informed consent was signed on the chart. Patient was seen history was reviewed and exam was performed.   The patient was then taken to the operating room and placed in the supine position with SCD hose on. General anesthesia was then induced without difficulty. She was then placed in the dorsolithotomy position. The perineum was prepped with Betadine. The vagina was prepped with Betadine. The patient was then draped after the prep was dried.   Timeout was performed the patient, procedure, antibiotic, allergy, and length of procedure.   The weighted speculum was placed in the posterior vagina. The single tooth tenaculum was placed on the anterior lip of the cervix. The uterine sound was placed in the cervix and advanced to the fundus. The cervix was successively dilated using pratts dilators to 23.  The Myosure hysteroscope was then inserted into the endometrial cavity with findings as noted above. The Myosure reach was used to sampling the cavity 360 degrees.   The specimen was collected in the Myosure sock and sent for permanent pathology.  The tenaculum was removed and hemostasis was observed.   The vagina was  irrigated.  All instrument, suture, laparotomy, Ray-Tec, and needle counts were correct x2. The patient tolerated the procedure well and was taken recovery room in stable condition.   Lafonda Mosses, MD

## 2021-05-19 NOTE — Anesthesia Postprocedure Evaluation (Signed)
Anesthesia Post Note  Patient: Haig Prophet  Procedure(s) Performed: DILATATION AND CURETTAGE, HYSTEROSCOPY WITH [place]     Patient location during evaluation: PACU Anesthesia Type: General Level of consciousness: awake and alert Pain management: pain level controlled Vital Signs Assessment: post-procedure vital signs reviewed and stable Respiratory status: spontaneous breathing, nonlabored ventilation, respiratory function stable and patient connected to nasal cannula oxygen Cardiovascular status: blood pressure returned to baseline and stable Postop Assessment: no apparent nausea or vomiting Anesthetic complications: no   No notable events documented.  Last Vitals:  Vitals:   05/19/21 0915 05/19/21 0930  BP:  (!) 175/90  Pulse:  82  Resp:  18  Temp:  36.4 C  SpO2: 93% 94%    Last Pain:  Vitals:   05/19/21 0930  TempSrc: Oral  PainSc: 0-No pain                 Delsie Amador S

## 2021-05-19 NOTE — Discharge Instructions (Addendum)
AFTER SURGERY INSTRUCTIONS   Return to work:  1-2 days if applicable   Activity: 1. Be up and out of the bed during the day.  Take a nap if needed.  You may walk up steps but be careful and use the hand rail.  Stair climbing will tire you more than you think, you may need to stop part way and rest.    2. No lifting or straining for 2 weeks over 10 pounds. No pushing, pulling, straining for 2 weeks.   3. No driving for minimum 24 hours after surgery.  Do not drive if you are taking narcotic pain medicine and make sure that your reaction time has returned.    4. You can shower as soon as the next day after surgery. Shower daily. No tub baths or submerging your body in water until cleared by your surgeon.    5. No sexual activity and nothing in the vagina for 4 weeks.   8. You may experience vaginal spotting and discharge after surgery.  The spotting is normal but if you experience heavy bleeding, call our office.   9. Take Tylenol or ibuprofen first for pain.  Monitor your Tylenol intake to a max of 4,000 mg in a 24 hour period. You can alternate these medications after surgery.   Diet: 1. Low sodium Heart Healthy Diet is recommended but you are cleared to resume your normal (before surgery) diet after your procedure.   2. It is safe to use a laxative, such as Miralax or Colace, if you have difficulty moving your bowels.    Wound Care: 1. Keep clean and dry.  Shower daily.   Reasons to call the Doctor: Fever - Oral temperature greater than 100.4 degrees Fahrenheit Foul-smelling vaginal discharge Difficulty urinating Nausea and vomiting Difficulty breathing with or without chest pain New calf pain especially if only on one side Sudden, continuing increased vaginal bleeding with or without clots.   Contacts: For questions or concerns you should contact:   Dr. Jeral Pinch at [phone]   Joylene John, NP at [phone]   After Hours: call [phone] and have the GYN  Oncologist paged/contacted (after 5 pm or on the weekends).   Messages sent via mychart are for non-urgent matters and are not responded to after hours so for urgent needs, please call the after hours number.                Review of Systems   Physical Exam

## 2021-05-19 NOTE — Interval H&P Note (Signed)
History and Physical Interval Note:  05/19/2021 7:05 AM  Amy [name]  has presented today for surgery, with the diagnosis of POST MENOPAUSAL BLEEDING, ABNORMAL ENDOMETRIAL BIOPSY.  The various methods of treatment have been discussed with the patient and family. After consideration of risks, benefits and other options for treatment, the patient has consented to  Procedure(s): DILATATION AND CURETTAGE, POSSIBLE HYSTEROSCOPY WITH MYOSURE (N/A) as a surgical intervention.  The patient's history has been reviewed, patient examined, no change in status, stable for surgery.  I have reviewed the patient's chart and labs.  Questions were answered to the patient's satisfaction.     Lafonda Mosses

## 2021-05-19 NOTE — Anesthesia Procedure Notes (Signed)
Procedure Name: Intubation Date/Time: 05/19/2021 7:51 AM Performed by: Maxwell Caul, CRNA Pre-anesthesia Checklist: Patient identified, Emergency Drugs available, Suction available and Patient being monitored Patient Re-evaluated:Patient Re-evaluated prior to induction Oxygen Delivery Method: Circle system utilized Preoxygenation: Pre-oxygenation with 100% oxygen Induction Type: IV induction Ventilation: Mask ventilation without difficulty Laryngoscope Size: Miller and 2 Grade View: Grade I Tube type: Oral Tube size: 7.0 mm Number of attempts: 1 Airway Equipment and Method: Stylet and Oral airway Placement Confirmation: ETT inserted through vocal cords under direct vision, positive ETCO2 and breath sounds checked- equal and bilateral Secured at: 21 cm Tube secured with: Tape Dental Injury: Teeth and Oropharynx as per pre-operative assessment

## 2021-05-19 NOTE — Transfer of Care (Signed)
Immediate Anesthesia Transfer of Care Note  Patient: Amy [name]  Procedure(s) Performed: DILATATION AND CURETTAGE, HYSTEROSCOPY WITH [place]  Patient Location: PACU  Anesthesia Type:General  Level of Consciousness: awake, alert  and oriented  Airway & Oxygen Therapy: Patient Spontanous Breathing and Patient connected to face mask oxygen  Post-op Assessment: Report given to RN and Post -op Vital signs reviewed and stable  Post vital signs: Reviewed and stable  Last Vitals:  Vitals Value Taken Time  BP 181/94 05/19/21 0835  Temp    Pulse 88 05/19/21 0836  Resp 20 05/19/21 0836  SpO2 100 % 05/19/21 0836  Vitals shown include unvalidated device data.  Last Pain:  Vitals:   05/19/21 0535  TempSrc: Oral         Complications: No notable events documented.

## 2021-05-20 ENCOUNTER — Telehealth: Payer: Self-pay

## 2021-05-20 ENCOUNTER — Telehealth: Payer: Self-pay | Admitting: Gynecologic Oncology

## 2021-05-20 ENCOUNTER — Encounter (HOSPITAL_COMMUNITY): Payer: Self-pay | Admitting: Gynecologic Oncology

## 2021-05-20 LAB — SURGICAL PATHOLOGY

## 2021-05-20 NOTE — Telephone Encounter (Signed)
Spoke with patient regarding path from surgery. She was very happy with this news. I will see her for follow-up in several weeks and then discharge her to continued care with her PCP and gyn.  Valarie Cones, MD

## 2021-05-20 NOTE — Telephone Encounter (Signed)
Called patient with results - no answer. Left VM requesting callback.  Valarie Cones MD

## 2021-05-20 NOTE — Telephone Encounter (Signed)
Spoke with Ms. Hirschhorn this morning. She states she is eating, drinking and urinating well. She has not had a BM yet but is passing gas. Patient states she is going to take a stool softener tonight, encouraged her to drink plenty of fluids. She denies fever or chills. Patient states she has not needed pain medicine she is sore. Instructed to take tylenol or advil as needed.  Instructed to call office with any fever, chills, purulent drainage, uncontrolled pain or any other questions or concerns. Patient verbalizes understanding.   Pt aware of post op appointments as well as the office number [phone] and after hours number [phone] to call if she has any questions or concerns

## 2021-05-20 NOTE — Telephone Encounter (Signed)
Left message requesting return call

## 2021-06-02 ENCOUNTER — Other Ambulatory Visit: Payer: Self-pay | Admitting: Internal Medicine

## 2021-06-02 DIAGNOSIS — Z1231 Encounter for screening mammogram for malignant neoplasm of breast: Secondary | ICD-10-CM

## 2021-06-16 ENCOUNTER — Encounter: Payer: Self-pay | Admitting: Gynecologic Oncology

## 2021-06-18 ENCOUNTER — Other Ambulatory Visit: Payer: Self-pay

## 2021-06-18 ENCOUNTER — Encounter: Payer: Self-pay | Admitting: Gynecologic Oncology

## 2021-06-18 ENCOUNTER — Inpatient Hospital Stay: Payer: 59 | Attending: Gynecologic Oncology | Admitting: Gynecologic Oncology

## 2021-06-18 VITALS — BP 189/90 | HR 97 | Temp 98.6°F | Resp 18 | Wt 329.5 lb

## 2021-06-18 DIAGNOSIS — N840 Polyp of corpus uteri: Secondary | ICD-10-CM | POA: Insufficient documentation

## 2021-06-18 DIAGNOSIS — Z6841 Body Mass Index (BMI) 40.0 and over, adult: Secondary | ICD-10-CM | POA: Insufficient documentation

## 2021-06-18 DIAGNOSIS — N950 Postmenopausal bleeding: Secondary | ICD-10-CM

## 2021-06-18 DIAGNOSIS — Z9889 Other specified postprocedural states: Secondary | ICD-10-CM

## 2021-06-18 HISTORY — DX: Polyp of corpus uteri: N84.0

## 2021-06-18 NOTE — Patient Instructions (Signed)
You are doing very well after surgery!  I will let your gynecologist know that pathology all came back benign from surgery.  Please reach out to her in the future if you develop any bleeding again.  In terms of applications on your phone that you can use to keep a food diary and track your activity, I like [employer]ose[employer]! And My fitness pal

## 2021-06-18 NOTE — Progress Notes (Signed)
Gynecologic Oncology Return Clinic Visit  06/18/2021  Reason for Visit: Postoperative follow-up, treatment planning  Treatment History: 05/19/2021: In the setting of postmenopausal bleeding and atypical cells on endometrial biopsy, the patient underwent hysteroscopy and endometrial sampling with the MyoSure.  Final pathology revealed a benign endometrial polyp, no hyperplasia or malignancy identified.  Interval History: Doing well since surgery.  Had bleeding for short period after surgery, denies any further vaginal bleeding or discharge.  Reports regular bowel and bladder function.  Tolerating regular diet without nausea or emesis.  Past Medical/Surgical History: Past Medical History:  Diagnosis Date   Anemia    Diabetes mellitus without complication (HCC)    HLD (hyperlipidemia)    Hypertension    OSA (obstructive sleep apnea)    APAP S10 4-20 obt 2021   Sleep apnea     Past Surgical History:  Procedure Laterality Date   APPENDECTOMY  2003   [place]   COLONOSCOPY WITH PROPOFOL N/A 12/13/2017   Procedure: COLONOSCOPY WITH PROPOFOL;  Surgeon: Otis Brace, MD;  Location: [place];  Service: Gastroenterology;  Laterality: N/A;   HYSTEROSCOPY WITH D & C N/A 05/19/2021   Procedure: DILATATION AND CURETTAGE, HYSTEROSCOPY WITH MYOSURE;  Surgeon: Amy Mosses, MD;  Location: [facility];  Service: Gynecology;  Laterality: N/A;   REDUCTION MAMMAPLASTY Bilateral 2010    Family History  Problem Relation Age of Onset   Aneurysm Father    Diabetes Brother    Ovarian cancer Maternal Aunt    Breast cancer Cousin    Breast cancer Cousin    Breast cancer Cousin    Colon cancer Cousin    Colon cancer Cousin    Prostate cancer Neg Hx    Pancreatic cancer Neg Hx    Cancer - Colon Neg Hx    Endometrial cancer Neg Hx     Social History   Socioeconomic History   Marital status: Single    Spouse name: Not on file   Number of children: Not on file   Years of  education: Not on file   Highest education level: Not on file  Occupational History   Not on file  Tobacco Use   Smoking status: Never   Smokeless tobacco: Never  Vaping Use   Vaping Use: Never used  Substance and Sexual Activity   Alcohol use: Not Currently    Comment: rare, 2 a year   Drug use: Never   Sexual activity: Not Currently    Birth control/protection: None  Other Topics Concern   Not on file  Social History Narrative   Not on file   Social Determinants of Health   Financial Resource Strain: Not on file  Food Insecurity: Not on file  Transportation Needs: Not on file  Physical Activity: Not on file  Stress: Not on file  Social Connections: Not on file    Current Medications:  Current Outpatient Medications:    aspirin EC 81 MG tablet, Take 81 mg by mouth daily. Swallow whole., Disp: , Rfl:    atorvastatin (LIPITOR) 40 MG tablet, Take 1 tablet (40 mg total) by mouth daily., Disp: 90 tablet, Rfl: 3   Cholecalciferol (VITAMIN D3) 25 MCG (1000 UT) CAPS, Take 1,000 Units by mouth daily., Disp: , Rfl:    insulin glargine, 1 Unit Dial, (TOUJEO SOLOSTAR) 300 UNIT/ML Solostar Pen, Inject 40 Units into the skin daily in the afternoon., Disp: 15 mL, Rfl: 3   insulin lispro (HUMALOG KWIKPEN) 100 UNIT/ML KwikPen, Inject 12  Units into the skin 3 (three) times daily. (Patient taking differently: Inject 15 Units into the skin 3 (three) times daily.), Disp: 30 mL, Rfl: 2   Insulin Pen Needle 31G X 5 MM MISC, 1 Device by Does not apply route in the morning, at noon, in the evening, and at bedtime., Disp: 400 each, Rfl: 3   irbesartan-hydrochlorothiazide (AVALIDE) 300-12.5 MG tablet, Take 1 tablet by mouth daily., Disp: 90 tablet, Rfl: 1   Semaglutide,0.25 or 0.5MG /DOS, (OZEMPIC, 0.25 OR 0.5 MG/DOSE,) 2 MG/1.5ML SOPN, Inject 0.5 mg into the skin once a week., Disp: 4.5 mL, Rfl: 2  Review of Systems: Denies appetite changes, fevers, chills, fatigue, unexplained weight  changes. Denies hearing loss, neck lumps or masses, mouth sores, ringing in ears or voice changes. Denies cough or wheezing.  Denies shortness of breath. Denies chest pain or palpitations. Denies leg swelling. Denies abdominal distention, pain, blood in stools, constipation, diarrhea, nausea, vomiting, or early satiety. Denies pain with intercourse, dysuria, frequency, hematuria or incontinence. Denies hot flashes, pelvic pain, vaginal bleeding or vaginal discharge.   Denies joint pain, back pain or muscle pain/cramps. Denies itching, rash, or wounds. Denies dizziness, headaches, numbness or seizures. Denies swollen lymph nodes or glands, denies easy bruising or bleeding. Denies anxiety, depression, confusion, or decreased concentration.  Physical Exam: BP (!) 189/90 (BP Location: Right Wrist, Patient Position: Sitting)   Pulse 97   Temp 98.6 F (37 C) (Oral)   Resp 18   Wt (!) 329 lb 8 oz (149.5 kg)   SpO2 98%   BMI 53.18 kg/m  Manual bp taken: 145/86 General: Alert, oriented, no acute distress. HEENT: Normocephalic, atraumatic, sclera anicteric. Chest: Unlabored breathing on room air. Extremities: Grossly normal range of motion.  Warm, well perfused.  No edema bilaterally.  Laboratory & Radiologic Studies: A. ENDOMETRIUM, CURETTAGE:  -  Benign endometrial polyp  -  No hyperplasia or malignancy identified   Assessment & Plan: Amy [name] is a 55 y.o. woman who is approximately 1 month status post hysteroscopy and endometrial sampling in the setting of postmenopausal bleeding and atypical cells on Pap smear with endometrial polyp noted on final pathology.  Patient is doing very well from a postoperative standpoint.  Discussed pathology results again with her.  Given endometrial polyp, which explains her bleeding, and no hyperplasia or malignancy, no additional treatment is indicated.  I discussed with her the increased estrogen exposure given her obesity.  We reviewed that  this increases her risk of overgrowth of the endometrium as well as hyperplasia and malignancy.  I have encouraged her to work towards weight loss.  We discussed apps that she could use to track her food intake and activity.  She was given the name of 2 of these applications that I think are very user-friendly.  22 minutes of total time was spent for this patient encounter, including preparation, face-to-face counseling with the patient and coordination of care, and documentation of the encounter.  Jeral Pinch, MD  Division of Gynecologic Oncology  Department of Obstetrics and Gynecology  [facility]

## 2021-06-20 ENCOUNTER — Encounter: Payer: 59 | Admitting: Gynecologic Oncology

## 2021-06-30 ENCOUNTER — Other Ambulatory Visit: Payer: Self-pay | Admitting: Internal Medicine

## 2021-06-30 ENCOUNTER — Other Ambulatory Visit: Payer: Self-pay

## 2021-06-30 ENCOUNTER — Ambulatory Visit
Admission: RE | Admit: 2021-06-30 | Discharge: 2021-06-30 | Disposition: A | Discharge status: Home / Self Care | Payer: 59 | Source: Ambulatory Visit | Attending: Internal Medicine | Admitting: Internal Medicine

## 2021-06-30 DIAGNOSIS — Z1231 Encounter for screening mammogram for malignant neoplasm of breast: Secondary | ICD-10-CM

## 2021-07-23 ENCOUNTER — Encounter: Payer: Self-pay | Admitting: Internal Medicine

## 2021-07-24 ENCOUNTER — Ambulatory Visit: Payer: 59 | Admitting: Internal Medicine

## 2021-10-04 ENCOUNTER — Other Ambulatory Visit: Payer: Self-pay | Admitting: Internal Medicine

## 2021-10-30 ENCOUNTER — Other Ambulatory Visit: Payer: Self-pay | Admitting: Internal Medicine

## 2021-11-12 ENCOUNTER — Other Ambulatory Visit: Payer: Self-pay | Admitting: Gastroenterology

## 2022-01-20 ENCOUNTER — Other Ambulatory Visit: Payer: Self-pay | Admitting: Internal Medicine

## 2022-01-27 ENCOUNTER — Other Ambulatory Visit: Payer: Self-pay | Admitting: Internal Medicine

## 2022-01-29 ENCOUNTER — Encounter (HOSPITAL_COMMUNITY): Payer: Self-pay | Admitting: Gastroenterology

## 2022-01-29 NOTE — Progress Notes (Signed)
Attempted to obtain medical history via telephone, unable to reach at this time. HIPAA compliant voicemail message left requesting return call to pre surgical testing department. 

## 2022-01-30 ENCOUNTER — Other Ambulatory Visit: Payer: Self-pay | Admitting: Internal Medicine

## 2022-02-09 ENCOUNTER — Other Ambulatory Visit: Payer: Self-pay | Admitting: Internal Medicine

## 2022-02-10 ENCOUNTER — Ambulatory Visit (HOSPITAL_COMMUNITY): Admission: RE | Admit: 2022-02-10 | Payer: 59 | Source: Home / Self Care | Admitting: Gastroenterology

## 2022-02-10 ENCOUNTER — Encounter (HOSPITAL_COMMUNITY): Admission: RE | Payer: Self-pay | Source: Home / Self Care

## 2022-02-10 SURGERY — COLONOSCOPY WITH PROPOFOL
Anesthesia: Monitor Anesthesia Care

## 2022-02-19 ENCOUNTER — Other Ambulatory Visit: Payer: Self-pay | Admitting: Internal Medicine

## 2022-04-08 ENCOUNTER — Other Ambulatory Visit: Payer: Self-pay | Admitting: Internal Medicine

## 2022-04-13 ENCOUNTER — Other Ambulatory Visit: Payer: Self-pay | Admitting: Internal Medicine

## 2022-04-22 ENCOUNTER — Other Ambulatory Visit: Payer: Self-pay | Admitting: Internal Medicine

## 2022-06-30 ENCOUNTER — Other Ambulatory Visit: Payer: Self-pay | Admitting: Internal Medicine

## 2022-06-30 DIAGNOSIS — Z1231 Encounter for screening mammogram for malignant neoplasm of breast: Secondary | ICD-10-CM

## 2022-07-21 ENCOUNTER — Other Ambulatory Visit: Payer: Self-pay | Admitting: Internal Medicine

## 2022-07-31 ENCOUNTER — Ambulatory Visit: Payer: 59

## 2022-09-04 ENCOUNTER — Other Ambulatory Visit: Payer: Self-pay | Admitting: Internal Medicine

## 2022-09-12 IMAGING — US US PELVIS COMPLETE WITH TRANSVAGINAL
1 series · 13 of 25 positions shown · non-contrast
Comparison: None available.

CLINICAL DATA: Initial evaluation for pelvic pain in female for 6
months. History of prior C-section.



[Series 1: us pelvis complete with transvaginal · 0.25mm/px · 13 of 33 slices shown]
[im 1/33]
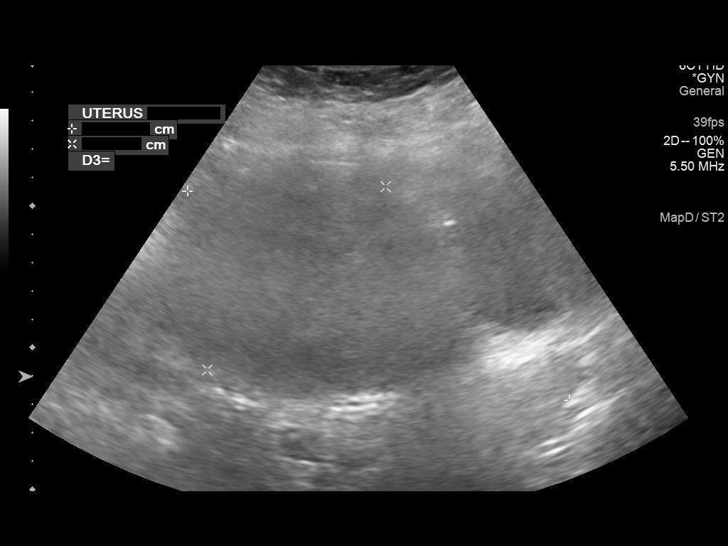
[im 3/33]
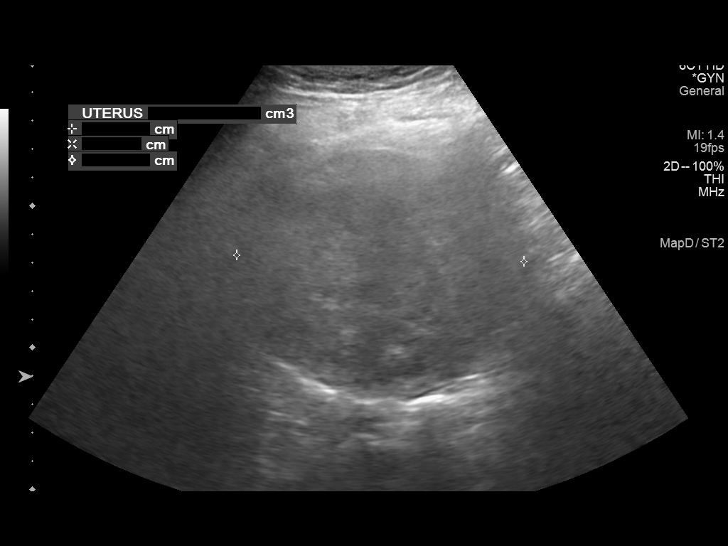
[im 6/33]
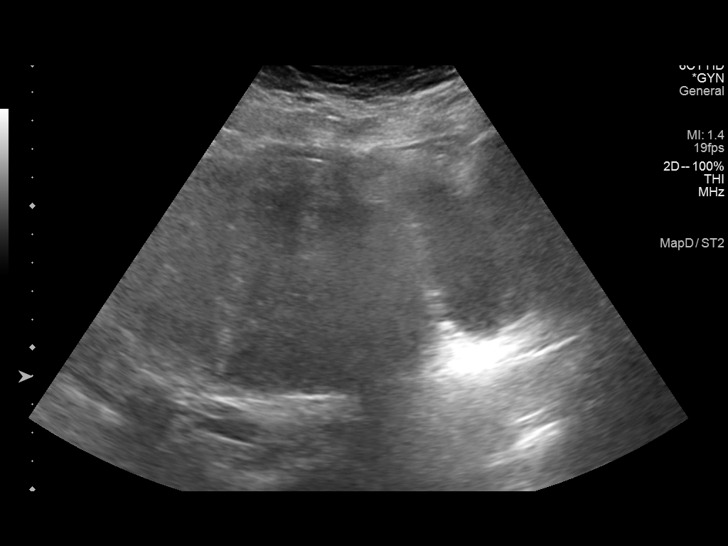
[im 9/33]
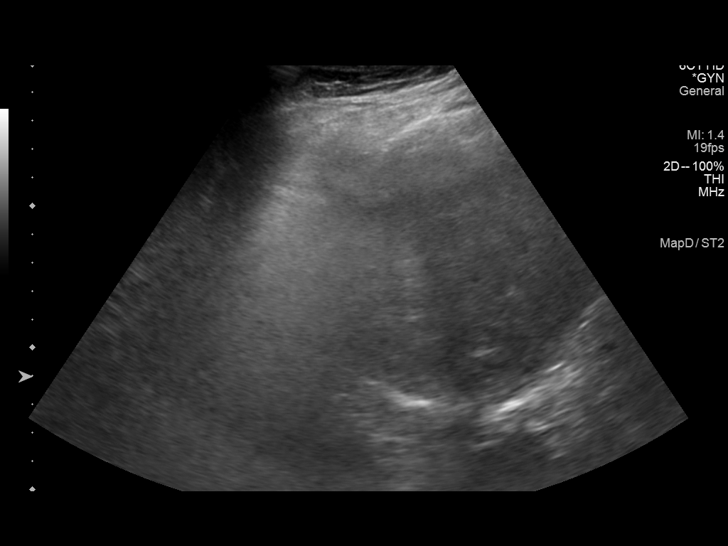
[im 11/33]
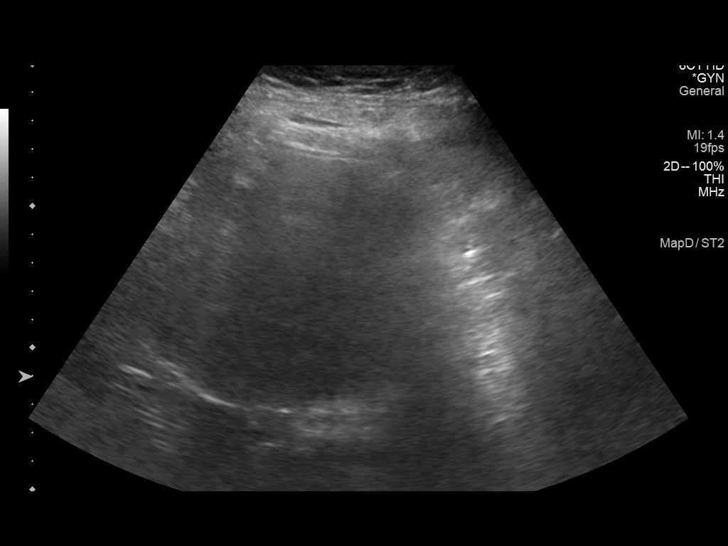
[im 14/33]
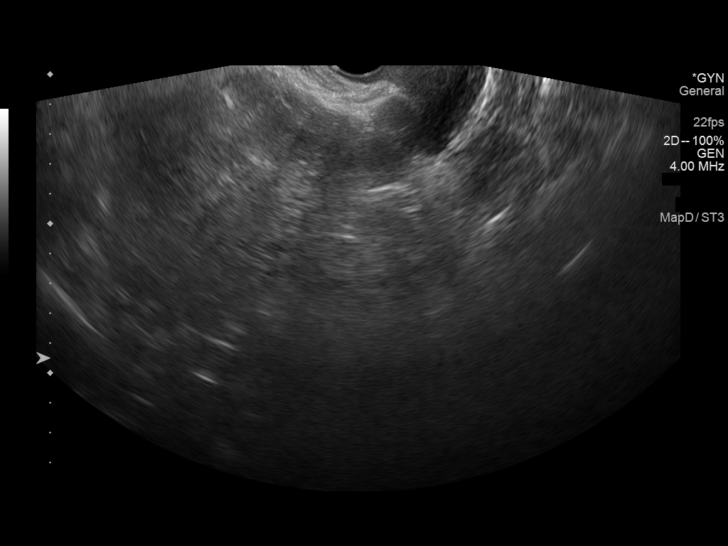
[im 17/33]
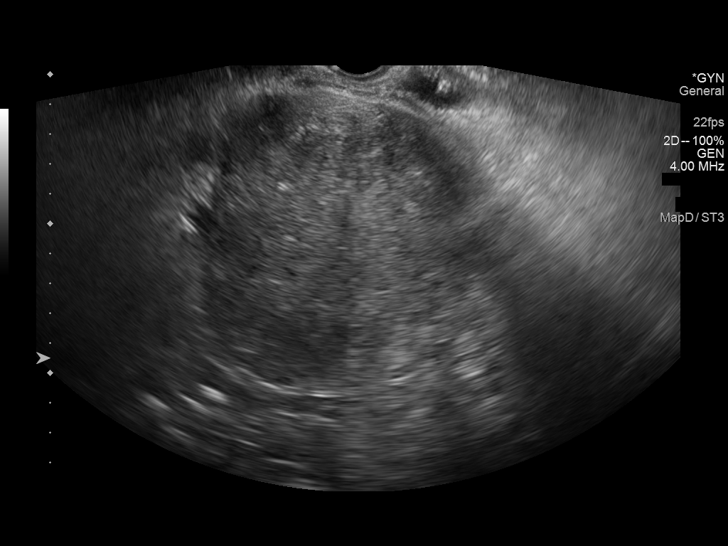
[im 19/33]
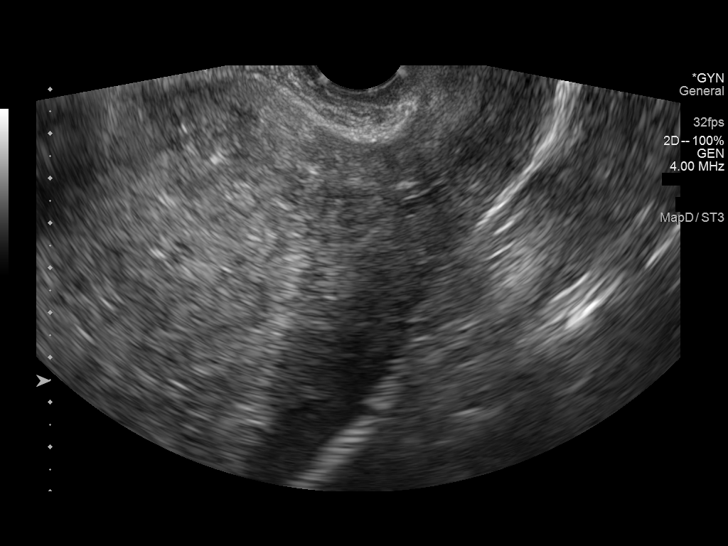
[im 22/33]
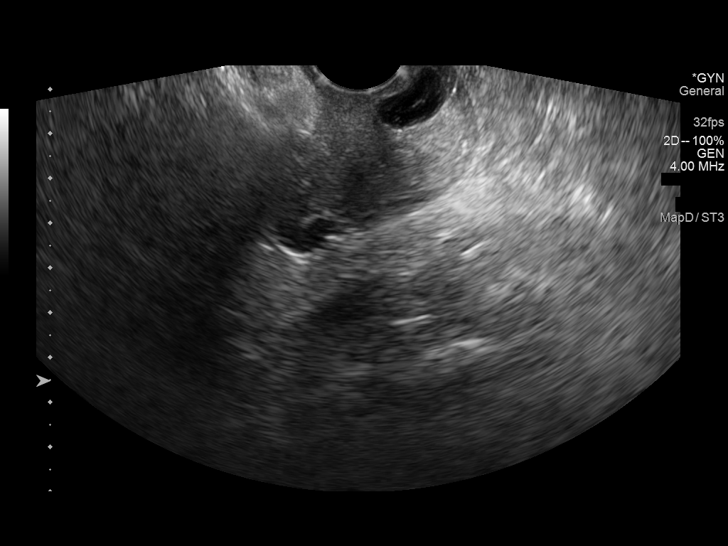
[im 25/33]
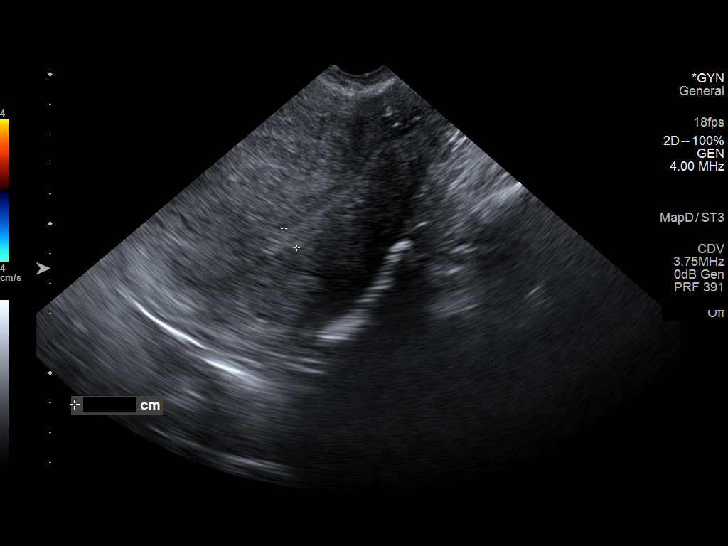
[im 27/33]
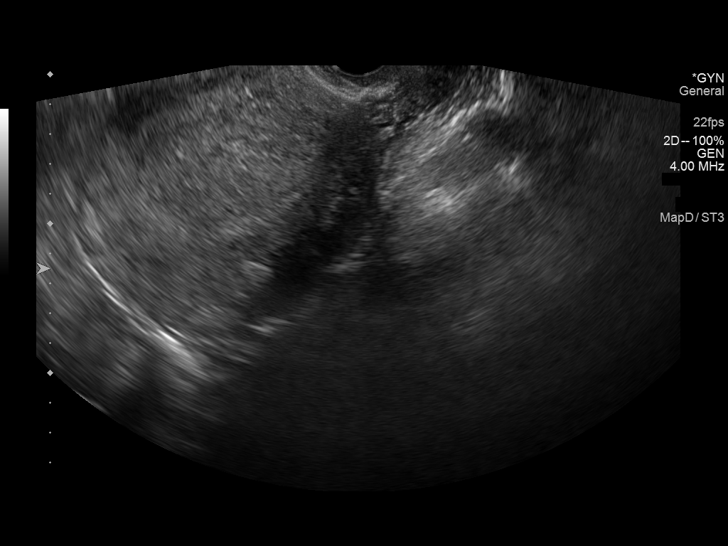
[im 30/33]
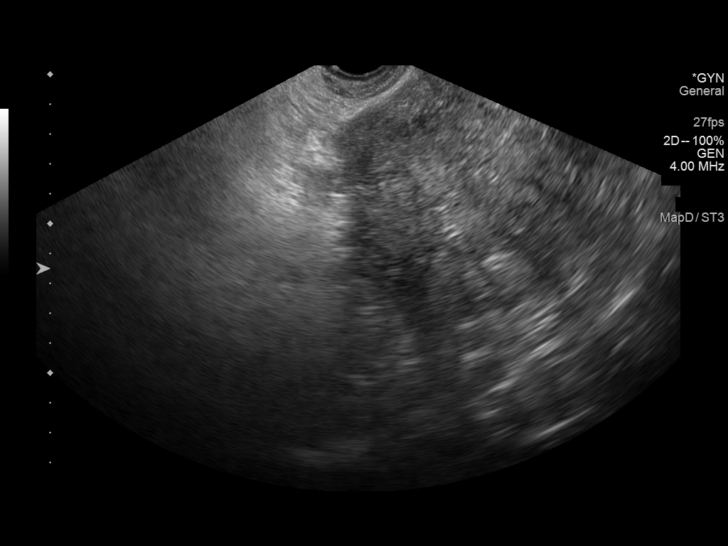
[im 33/33]
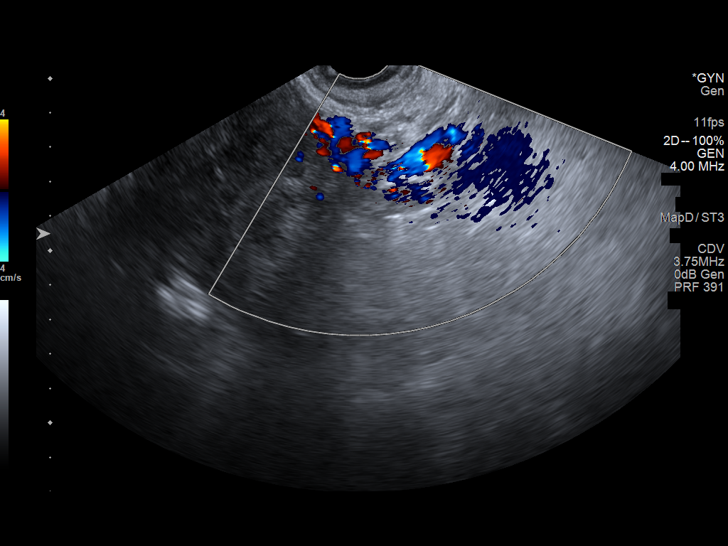

[13 of 25 positions shown; findings below may reference images not displayed]

FINDINGS: Uterus

Measurements: 15.4 x 9.0 x 10.1 cm = volume: 734.4 mL. Anteverted
uterus. Uterus is somewhat enlarged with globular morphology and
heterogeneous echotexture within the uterine myometrium, suggesting
adenomyosis. No definite discrete fibroid.

Endometrium

Thickness: 7.7 mm.  No focal abnormality visualized.

Right ovary

Not visualized.  No adnexal mass.

Left ovary

Not visualized.  No adnexal mass.

Other findings

No abnormal free fluid.
IMPRESSION: 1. Enlarged uterus with globular morphology and heterogeneous
echotexture of the uterine myometrium, suggesting adenomyosis. No
discrete fibroids.
2. Normal endometrium.
3. Nonvisualization of either ovary.  No adnexal mass or free fluid.

## 2022-09-23 ENCOUNTER — Ambulatory Visit
Admission: RE | Admit: 2022-09-23 | Discharge: 2022-09-23 | Disposition: A | Discharge status: Home / Self Care | Payer: 59 | Source: Ambulatory Visit | Attending: Internal Medicine | Admitting: Internal Medicine

## 2022-09-23 DIAGNOSIS — Z1231 Encounter for screening mammogram for malignant neoplasm of breast: Secondary | ICD-10-CM

## 2022-10-29 ENCOUNTER — Other Ambulatory Visit: Payer: Self-pay | Admitting: Internal Medicine

## 2022-11-12 ENCOUNTER — Other Ambulatory Visit: Payer: Self-pay | Admitting: Internal Medicine

## 2023-05-31 ENCOUNTER — Ambulatory Visit (INDEPENDENT_AMBULATORY_CARE_PROVIDER_SITE_OTHER): Payer: 59 | Admitting: Ophthalmology

## 2023-05-31 ENCOUNTER — Encounter (INDEPENDENT_AMBULATORY_CARE_PROVIDER_SITE_OTHER): Payer: Self-pay | Admitting: Ophthalmology

## 2023-05-31 VITALS — BP 165/84

## 2023-05-31 DIAGNOSIS — I10 Essential (primary) hypertension: Secondary | ICD-10-CM | POA: Diagnosis not present

## 2023-05-31 DIAGNOSIS — H35033 Hypertensive retinopathy, bilateral: Secondary | ICD-10-CM

## 2023-05-31 DIAGNOSIS — Z7985 Long-term (current) use of injectable non-insulin antidiabetic drugs: Secondary | ICD-10-CM

## 2023-05-31 DIAGNOSIS — E113313 Type 2 diabetes mellitus with moderate nonproliferative diabetic retinopathy with macular edema, bilateral: Secondary | ICD-10-CM | POA: Diagnosis not present

## 2023-05-31 DIAGNOSIS — Z7984 Long term (current) use of oral hypoglycemic drugs: Secondary | ICD-10-CM

## 2023-05-31 DIAGNOSIS — H25813 Combined forms of age-related cataract, bilateral: Secondary | ICD-10-CM

## 2023-05-31 MED ORDER — BEVACIZUMAB CHEMO INJECTION 1.25MG/0.05ML SYRINGE FOR KALEIDOSCOPE
1.2500 mg | INTRAVITREAL | Status: AC | PRN
  Administered 2023-05-31: 1.25 mg via INTRAVITREAL

## 2023-05-31 NOTE — Progress Notes (Signed)
[facility] - Clinic Note  05/31/2023   CHIEF COMPLAINT Patient presents for Retina Evaluation  HISTORY OF PRESENT ILLNESS: Amy [name] is a 57 y.o. female who presents to the clinic today for:  HPI     Retina Evaluation   In both eyes.  This started years ago.  Associated Symptoms Negative for Flashes, Floaters, Distortion and Blind Spot.  Context:  distance vision.  I, the attending physician,  performed the HPI with the patient and updated documentation appropriately.        Comments   Patient is here today based a referral from Dr. Bascom Levels for a diabetic exam. She has been a diabetic over 10 yrs. She does not check her sugar. She is using AT's OU PRN for dry eyes.       Last edited by Rennis Chris, MD on 05/31/2023  6:07 PM.    Pt is here on the referral of Dr. Bascom Levels for concern of DME, pt states her regular eye dr is [facility] on [address], she states they told her they referred her for diabetes last year, but the appt was never made, they referred her to Dr. Bascom Levels when she saw them this year, pt thinks she had her A1c checked last year, but epic has 10.8 in September 2022   Referring physician: [place], [place], OD [address] [place],  [place] [address]  HISTORICAL INFORMATION:  Selected notes from the MEDICAL RECORD NUMBER Referred by Dr. Bascom Levels for concern of DME LEE:  Ocular Hx- PMH-   CURRENT MEDICATIONS: No current outpatient medications on file. (Ophthalmic Drugs)   No current facility-administered medications for this visit. (Ophthalmic Drugs)   Current Outpatient Medications (Other)  Medication Sig   aspirin EC 81 MG tablet Take 81 mg by mouth every other day. Swallow whole.   atorvastatin (LIPITOR) 40 MG tablet TAKE 1 TABLET BY MOUTH  DAILY   Cholecalciferol (VITAMIN D3) 25 MCG (1000 UT) CAPS Take 1,000 Units by mouth daily.   HUMALOG KWIKPEN 100 UNIT/ML KwikPen INJECT SUBCUTANEOUSLY 12 UNITS 3 TIMES DAILY    Insulin Pen Needle 31G X 5 MM MISC 1 Device by Does not apply route in the morning, at noon, in the evening, and at bedtime.   irbesartan-hydrochlorothiazide (AVALIDE) 300-12.5 MG tablet TAKE 1 TABLET BY MOUTH  DAILY   TOUJEO SOLOSTAR 300 UNIT/ML Solostar Pen INJECT 40 UNITS  SUBCUTANEOUSLY DAILY IN THE AFTERNOON   ferrous sulfate 325 (65 FE) MG tablet Take 325 mg by mouth daily with breakfast.   OZEMPIC, 0.25 OR 0.5 MG/DOSE, 2 MG/3ML SOPN INJECT SUBCUTANEOUSLY 0.5 MG  WEEKLY   No current facility-administered medications for this visit. (Other)   REVIEW OF SYSTEMS: ROS   Positive for: Endocrine, Eyes, Respiratory Last edited by Charlette Caffey, COT on 05/31/2023  2:05 PM.     ALLERGIES Allergies  Allergen Reactions   Lisinopril Swelling   PAST MEDICAL HISTORY Past Medical History:  Diagnosis Date   Anemia    Diabetes mellitus without complication (HCC)    HLD (hyperlipidemia)    Hypertension    OSA (obstructive sleep apnea)    APAP S10 4-20 obt 2021   Sleep apnea    Past Surgical History:  Procedure Laterality Date   APPENDECTOMY  2003   CESAREAN SECTION  1995   COLONOSCOPY WITH PROPOFOL N/A 12/13/2017   Procedure: COLONOSCOPY WITH PROPOFOL;  Surgeon: Kathi Der, MD;  Location: [facility];  Service: Gastroenterology;  Laterality: N/A;   HYSTEROSCOPY WITH D & C N/A 05/19/2021   Procedure: DILATATION AND CURETTAGE, HYSTEROSCOPY WITH MYOSURE;  Surgeon: Carver Fila, MD;  Location: [facility];  Service: Gynecology;  Laterality: N/A;   REDUCTION MAMMAPLASTY Bilateral 2010   FAMILY HISTORY Family History  Problem Relation Age of Onset   Aneurysm Father    Ovarian cancer Maternal Aunt    Colon cancer Cousin    Colon cancer Cousin    Diabetes Brother    Prostate cancer Neg Hx    Pancreatic cancer Neg Hx    Cancer - Colon Neg Hx    Endometrial cancer Neg Hx    SOCIAL HISTORY Social History   Tobacco Use   Smoking status: Never   Smokeless tobacco: Never   Vaping Use   Vaping status: Never Used  Substance Use Topics   Alcohol use: Not Currently    Comment: rare, 2 a year   Drug use: Never       OPHTHALMIC EXAM:  Base Eye Exam     Visual Acuity (Snellen - Linear)       Right Left   Dist cc 20/25 +2 20/25 +2   Dist ph cc 20/20 20/20 +2    Correction: Glasses         Tonometry (Tonopen, 2:09 PM)       Right Left   Pressure 12 10         Pupils       Dark Light Shape React APD   Right 4 3 Round Brisk None   Left 4 3 Round Brisk None         Visual Fields       Left Right    Full Full         Extraocular Movement       Right Left    Full, Ortho Full, Ortho         Neuro/Psych     Oriented x3: Yes         Dilation     Both eyes: 1.0% Mydriacyl, 2.5% Phenylephrine @ 2:06 PM           Slit Lamp and Fundus Exam     Slit Lamp Exam       Right Left   Lids/Lashes Dermatochalasis - upper lid Dermatochalasis - upper lid   Conjunctiva/Sclera Melanosis Melanosis   Cornea trace tear film debris arcus, trace tear film debris   Anterior Chamber deep and clear deep and clear   Iris Round and dilated, No NVI Round and dilated, No NVI   Lens 2+ Nuclear sclerosis, 2+ Cortical cataract, 1+ Posterior subcapsular cataract 2+ Cortical cataract, +Vacuoles   Anterior Vitreous syneresis syneresis         Fundus Exam       Right Left   Disc Pink and Sharp Pink and Sharp, +cupping   C/D Ratio 0.5 0.65   Macula Blunted foveal reflex, scattered MA/DBH and exudates greatest superior and nasal mac Blunted foveal reflex, scattered MA/DBH and punctate exudates greatest superior mac   Vessels attenuated, Tortuous, mild AV crossing changes, no NV attenuated, Tortuous, mild copper wiring, mild AV crossing changes, no NV   Periphery Attached, scattered MA / DBH greatest nasal to disc Attached, rare MA / DBH greatest nasal to disc           Refraction     Wearing Rx       Sphere Cylinder Axis   Right  -2.50 +  1.00 001   Left -2.75 +1.00 008           IMAGING AND PROCEDURES  Imaging and Procedures for 05/31/2023  Fluorescein Angiography Optos (Transit OD)       Right Eye Progression has no prior data. Early phase findings include microaneurysm. Mid/Late phase findings include leakage, microaneurysm (Scattered leaking MA mostly posterior, No NV).   Left Eye Progression has no prior data. Early phase findings include microaneurysm. Mid/Late phase findings include leakage, microaneurysm (Focal clusters of leaking MA posteriorly greatest superior macula).   Notes *Images captured and stored on drive  Diagnosis / Impression:  Moderate NPDR OU OD: Scattered leaking MA mostly posterior, No NV OS: Focal clusters of leaking MA posteriorly greatest superior macula  Clinical management:  See below  Abbreviations: NFP - Normal foveal profile. CME - cystoid macular edema. PED - pigment epithelial detachment. IRF - intraretinal fluid. SRF - subretinal fluid. EZ - ellipsoid zone. ERM - epiretinal membrane. ORA - outer retinal atrophy. ORT - outer retinal tubulation. SRHM - subretinal hyper-reflective material. IRHM - intraretinal hyper-reflective material      OCT, Retina - OU - Both Eyes       Right Eye Quality was good. Central Foveal Thickness: 273. Progression has no prior data. Findings include normal foveal contour, no SRF, intraretinal hyper-reflective material, intraretinal fluid, vitreomacular adhesion (Scattered IRF/IRHM greatest SN mac).   Left Eye Quality was good. Central Foveal Thickness: 267. Progression has no prior data. Findings include no SRF, abnormal foveal contour, intraretinal hyper-reflective material, intraretinal fluid, vitreomacular adhesion (Scattered IRF/IRHM greatest superior mac).   Notes *Images captured and stored on drive  Diagnosis / Impression:  OD: Scattered IRF/IRHM greatest SN mac OS: Scattered IRF/IRHM greatest superior mac  Clinical  management:  See below  Abbreviations: NFP - Normal foveal profile. CME - cystoid macular edema. PED - pigment epithelial detachment. IRF - intraretinal fluid. SRF - subretinal fluid. EZ - ellipsoid zone. ERM - epiretinal membrane. ORA - outer retinal atrophy. ORT - outer retinal tubulation. SRHM - subretinal hyper-reflective material. IRHM - intraretinal hyper-reflective material      Intravitreal Injection, Pharmacologic Agent - OS - Left Eye       Time Out 05/31/2023. 3:48 PM. Confirmed correct patient, procedure, site, and patient consented.   Anesthesia Topical anesthesia was used. Anesthetic medications included Lidocaine 2%, Proparacaine 0.5%.   Procedure Preparation included 5% betadine to ocular surface, eyelid speculum. A (32g) needle was used.   Injection: 1.25 mg Bevacizumab 1.25mg /0.55ml   Route: Intravitreal, Site: Left Eye   NDC: [id number], Lot: [id number], Expiration date: 06/29/2023   Post-op Post injection exam found visual acuity of at least counting fingers. The patient tolerated the procedure well. There were no complications. The patient received written and verbal post procedure care education.           ASSESSMENT/PLAN:   ICD-10-CM   1. Moderate nonproliferative diabetic retinopathy of both eyes with macular edema associated with type 2 diabetes mellitus (HCC)  E11.3313 OCT, Retina - OU - Both Eyes    Intravitreal Injection, Pharmacologic Agent - OS - Left Eye    Bevacizumab (AVASTIN) SOLN 1.25 mg    2. Long term (current) use of oral hypoglycemic drugs  Z79.84     3. Long-term (current) use of injectable non-insulin antidiabetic drugs  Z79.85     4. Essential hypertension  I10     5. Hypertensive retinopathy of both eyes  H35.033 Fluorescein Angiography Optos (Transit OD)  6. Combined forms of age-related cataract of both eyes  H25.813      1-3. Moderate Non-proliferative diabetic retinopathy, both eyes  - last A1c was 10.8 in September  2022 - The incidence, risk factors for progression, natural history and treatment options for diabetic retinopathy were discussed with patient.   - The need for close monitoring of blood glucose, blood pressure, and serum lipids, avoiding cigarette or any type of tobacco, and the need for long term follow up was also discussed with patient. - exam scattered MA/DBH and exudates OU - FA (10.07.24) shows OD: Scattered leaking MA mostly posterior, No NV; OS: Focal clusters of leaking MA posteriorly greatest superior macula, no NV - OCT shows diabetic macular edema, both eyes  - The natural history, pathology, and characteristics of diabetic macular edema discussed with patient.  A generalized discussion of the major clinical trials concerning treatment of diabetic macular edema (ETDRS, DCT, SCORE, RISE / RIDE, and ongoing DRCR net studies) was completed.  This discussion included mention of the various approaches to treating diabetic macular edema (observation, laser photocoagulation, anti-VEGF injections with lucentis / Avastin / Eylea, steroid injections with Kenalog / Ozurdex, and intraocular surgery with vitrectomy).  The goal hemoglobin A1C of 6-7 was discussed, as well as importance of smoking cessation and hypertension control.  Need for ongoing treatment and monitoring were specifically discussed with reference to chronic nature of diabetic macular edema. - BCVA 20/20 OU - recommend IVA #1 OS today, 10.07.24 for DME - pt wishes to proceed with injection - RBA of procedure discussed, questions answered - IVA informed consent obtained and signed, 10.07.24 - see procedure note - f/u in 4 wks -- DFE/OCT, possible injection(s)  4,5. Hypertensive retinopathy OU - discussed importance of tight BP control - monitor  6. Mixed Cataract OU - The symptoms of cataract, surgical options, and treatments and risks were discussed with patient. - discussed diagnosis and progression - monitor  Ophthalmic  Meds Ordered this visit:  Meds ordered this encounter  Medications   Bevacizumab (AVASTIN) SOLN 1.25 mg     Return in about 4 weeks (around 06/28/2023) for mod NPDR, Dilated Exam, OCT, Possible Injxn.  There are no Patient Instructions on file for this visit.  Explained the diagnoses, plan, and follow up with the patient and they expressed understanding.  Patient expressed understanding of the importance of proper follow up care.   This document serves as a record of services personally performed by Karie Chimera, MD, PhD. It was created on their behalf by Glee Arvin. Manson Passey, OA an ophthalmic technician. The creation of this record is the provider's dictation and/or activities during the visit.    Electronically signed by: Glee Arvin. Manson Passey, OA 05/31/23 10:14 PM  Karie Chimera, M.D., Ph.D. Diseases & Surgery of the Retina and Vitreous [facility] 05/31/2023  I have reviewed the above documentation for accuracy and completeness, and I agree with the above. Karie Chimera, M.D., Ph.D. 05/31/23 10:16 PM   Abbreviations: M myopia (nearsighted); A astigmatism; H hyperopia (farsighted); P presbyopia; Mrx spectacle prescription;  CTL contact lenses; OD right eye; OS left eye; OU both eyes  XT exotropia; ET esotropia; PEK punctate epithelial keratitis; PEE punctate epithelial erosions; DES dry eye syndrome; MGD meibomian gland dysfunction; ATs artificial tears; PFAT's preservative free artificial tears; NSC nuclear sclerotic cataract; PSC posterior subcapsular cataract; ERM epi-retinal membrane; PVD posterior vitreous detachment; RD retinal detachment; DM diabetes mellitus; DR diabetic retinopathy; NPDR non-proliferative  diabetic retinopathy; PDR proliferative diabetic retinopathy; CSME clinically significant macular edema; DME diabetic macular edema; dbh dot blot hemorrhages; CWS cotton wool spot; POAG primary open angle glaucoma; C/D cup-to-disc ratio; HVF humphrey visual  field; GVF goldmann visual field; OCT optical coherence tomography; IOP intraocular pressure; BRVO Branch retinal vein occlusion; CRVO central retinal vein occlusion; CRAO central retinal artery occlusion; BRAO branch retinal artery occlusion; RT retinal tear; SB scleral buckle; PPV pars plana vitrectomy; VH Vitreous hemorrhage; PRP panretinal laser photocoagulation; IVK intravitreal kenalog; VMT vitreomacular traction; MH Macular hole;  NVD neovascularization of the disc; NVE neovascularization elsewhere; AREDS age related eye disease study; ARMD age related macular degeneration; POAG primary open angle glaucoma; EBMD epithelial/anterior basement membrane dystrophy; ACIOL anterior chamber intraocular lens; IOL intraocular lens; PCIOL posterior chamber intraocular lens; Phaco/IOL phacoemulsification with intraocular lens placement; PRK photorefractive keratectomy; LASIK laser assisted in situ keratomileusis; HTN hypertension; DM diabetes mellitus; COPD chronic obstructive pulmonary disease

## 2023-06-28 ENCOUNTER — Encounter (INDEPENDENT_AMBULATORY_CARE_PROVIDER_SITE_OTHER): Payer: 59 | Admitting: Ophthalmology

## 2023-10-06 ENCOUNTER — Other Ambulatory Visit: Payer: Self-pay | Admitting: Internal Medicine

## 2023-10-06 DIAGNOSIS — Z1231 Encounter for screening mammogram for malignant neoplasm of breast: Secondary | ICD-10-CM

## 2023-10-08 ENCOUNTER — Ambulatory Visit
Admission: RE | Admit: 2023-10-08 | Discharge: 2023-10-08 | Disposition: A | Discharge status: Home / Self Care | Payer: No Typology Code available for payment source | Source: Ambulatory Visit | Attending: Internal Medicine | Admitting: Internal Medicine

## 2023-10-08 DIAGNOSIS — Z1231 Encounter for screening mammogram for malignant neoplasm of breast: Secondary | ICD-10-CM

## 2023-12-05 ENCOUNTER — Encounter (HOSPITAL_BASED_OUTPATIENT_CLINIC_OR_DEPARTMENT_OTHER): Payer: Self-pay | Admitting: Emergency Medicine

## 2023-12-05 ENCOUNTER — Other Ambulatory Visit: Payer: Self-pay

## 2023-12-05 ENCOUNTER — Emergency Department (HOSPITAL_BASED_OUTPATIENT_CLINIC_OR_DEPARTMENT_OTHER)

## 2023-12-05 ENCOUNTER — Emergency Department (HOSPITAL_BASED_OUTPATIENT_CLINIC_OR_DEPARTMENT_OTHER): Admitting: Radiology

## 2023-12-05 ENCOUNTER — Emergency Department (HOSPITAL_BASED_OUTPATIENT_CLINIC_OR_DEPARTMENT_OTHER)
Admission: EM | Admit: 2023-12-05 | Discharge: 2023-12-05 | Disposition: A | Discharge status: Home / Self Care | Attending: Emergency Medicine | Admitting: Emergency Medicine

## 2023-12-05 DIAGNOSIS — M1711 Unilateral primary osteoarthritis, right knee: Secondary | ICD-10-CM | POA: Diagnosis not present

## 2023-12-05 DIAGNOSIS — M25561 Pain in right knee: Secondary | ICD-10-CM

## 2023-12-05 DIAGNOSIS — Z7982 Long term (current) use of aspirin: Secondary | ICD-10-CM | POA: Insufficient documentation

## 2023-12-05 MED ORDER — MELOXICAM 7.5 MG PO TABS: 7.5000 mg | ORAL_TABLET | Freq: Every day | ORAL | 0 refills | Status: AC

## 2023-12-05 NOTE — Discharge Instructions (Addendum)
 Please read and follow all provided instructions.  Your diagnoses today include:  1. Acute pain of right knee   2. Primary osteoarthritis of right knee     Tests performed today include: An x-ray of the affected area - does NOT show any broken bones Of the lower extremity does not show any signs of blood clot today Vital signs. See below for your results today.   Medications prescribed:  Meloxicam - anti-inflammatory pain medication  You have been prescribed an anti-inflammatory medication or NSAID. Take with food. Do not take aspirin, ibuprofen, or naproxen if taking this medication. Take smallest effective dose for the shortest duration needed for your pain. Stop taking if you experience stomach pain or vomiting.  Please make sure you are taking your blood pressure medication as prescribed.    Take any prescribed medications only as directed.  Home care instructions:  Follow any educational materials contained in this packet Follow R.I.C.E. Protocol: R - rest your injury  I  - use ice on injury without applying directly to skin C - compress injury with bandage or splint E - elevate the injury as much as possible  Follow-up instructions: Please follow-up with your primary care provider or the provided orthopedic physician (bone specialist) if you continue to have significant pain in 1 week. In this case you may have a more severe injury that requires further care.   Return instructions:  Please return if your toes or feet are numb or tingling, appear gray or blue, or you have severe pain (also elevate the leg and loosen splint or wrap if you were given one) Please return to the Emergency Department if you experience worsening symptoms.  Please return if you have any other emergent concerns.  Additional Information:  Your vital signs today were: BP (!) 175/66 (BP Location: Right Arm)   Pulse 80   Temp 98.6 F (37 C) (Oral)   Resp 20   Ht 5\' 6"  (1.676 m)   Wt (!) 149.5 kg    SpO2 96%   BMI 53.20 kg/m  If your blood pressure (BP) was elevated above 135/85 this visit, please have this repeated by your doctor within one month.

## 2023-12-05 NOTE — ED Triage Notes (Signed)
 Pt via pov from UC with right knee pain x 10 days. States it is very painful to bear weight; sent to rule out DVT. Pt alert & oriented, nad noted.

## 2023-12-05 NOTE — ED Provider Notes (Signed)
 [place] EMERGENCY DEPARTMENT AT [facility] Provider Note   CSN: [id number] Arrival date & time: 12/05/23  1511     History  Chief Complaint  Patient presents with   Knee Pain    Amy [name] is a 58 y.o. female.  Patient presents to the emergency department today for evaluation of right knee pain.  Patient reports intermittent episodes of stiffness in her right knee that usually gets better with activity during the day.  Patient has had more constant pain, worse with bearing weight and walking over the past 10 days.  She denies any falls or injuries.  No history of gout.  No significant swelling.  Patient went to an urgent care prior to arrival and was sent here for DVT rule out.  She states that they measured her leg and was concerned that it was larger than the other.  Patient has not had x-rays.  She denies thigh or calf pain.  No history of blood clots.  No distal numbness or tingling.  No history of gout or other inflammatory arthritis.  She has taken an occasional Aleve.  Over the past 2 days she has been applying Biofreeze without improvement.       Home Medications Prior to Admission medications   Medication Sig Start Date End Date Taking? Authorizing Provider  aspirin EC 81 MG tablet Take 81 mg by mouth every other day. Swallow whole.    [provider]  atorvastatin (LIPITOR) 40 MG tablet TAKE 1 TABLET BY MOUTH  DAILY 04/13/22   Shamleffer, Ibtehal Jaralla, MD  Cholecalciferol (VITAMIN D3) 25 MCG (1000 UT) CAPS Take 1,000 Units by mouth daily.    [provider]  ferrous sulfate 325 (65 FE) MG tablet Take 325 mg by mouth daily with breakfast.    [provider]  HUMALOG KWIKPEN 100 UNIT/ML KwikPen INJECT SUBCUTANEOUSLY 12 UNITS 3 TIMES DAILY 02/19/22   Shamleffer, Ibtehal Jaralla, MD  Insulin Pen Needle 31G X 5 MM MISC 1 Device by Does not apply route in the morning, at noon, in the evening, and at bedtime. 04/18/21   Shamleffer,  Ibtehal Jaralla, MD  irbesartan-hydrochlorothiazide (AVALIDE) 300-12.5 MG tablet TAKE 1 TABLET BY MOUTH  DAILY 10/06/21   Shamleffer, Ibtehal Jaralla, MD  OZEMPIC, 0.25 OR 0.5 MG/DOSE, 2 MG/3ML SOPN INJECT SUBCUTANEOUSLY 0.5 MG  WEEKLY 04/08/22   Shamleffer, Ibtehal Jaralla, MD  TOUJEO SOLOSTAR 300 UNIT/ML Solostar Pen INJECT 40 UNITS  SUBCUTANEOUSLY DAILY IN THE AFTERNOON 04/22/22   Shamleffer, Ibtehal Jaralla, MD      Allergies    Lisinopril    Review of Systems   Review of Systems  Physical Exam Updated Vital Signs BP (!) 175/66 (BP Location: Right Arm)   Pulse 80   Temp 98.6 F (37 C) (Oral)   Resp 20   Ht 5\' 6"  (1.676 m)   Wt (!) 149.5 kg   SpO2 96%   BMI 53.20 kg/m   Physical Exam Vitals and nursing note reviewed.  Constitutional:      Appearance: She is well-developed.  HENT:     Head: Normocephalic and atraumatic.  Eyes:     Pupils: Pupils are equal, round, and reactive to light.  Cardiovascular:     Pulses: Normal pulses. No decreased pulses.          Dorsalis pedis pulses are 2+ on the right side and 2+ on the left side.  Musculoskeletal:        General: Tenderness present.  Cervical back: Normal range of motion and neck supple.     Right knee: No effusion. Normal range of motion. Tenderness present.     Comments: Tenderness over the area of the patellar tendon, inferior to the patella.  No redness or warmth.  No effusion.  Patient is able to flex the knee while in bed without difficulty or significant pain.  No tenderness of the thigh or calf.  Skin:    General: Skin is warm and dry.  Neurological:     Mental Status: She is alert.     Sensory: No sensory deficit.     Comments: Motor, sensation, and vascular distal to the injury is fully intact.   Psychiatric:        Mood and Affect: Mood normal.     ED Results / Procedures / Treatments   Labs (all labs ordered are listed, but only abnormal results are displayed) Labs Reviewed - No data to  display  EKG None  Radiology No results found.  Procedures Procedures    Medications Ordered in ED Medications - No data to display  ED Course/ Medical Decision Making/ A&P    Patient seen and examined. History obtained directly from patient.   Labs/EKG: None ordered Imaging: Ordered DVT study, x-ray of the right knee Medications/Fluids: None ordered  Most recent vital signs reviewed and are as follows: BP (!) 175/66 (BP Location: Right Arm)   Pulse 80   Temp 98.6 F (37 C) (Oral)   Resp 20   Ht 5\' 6"  (1.676 m)   Wt (!) 149.5 kg   SpO2 96%   BMI 53.20 kg/m   Initial impression: Right knee pain, rule out DVT, low clinical concern.  Symptoms not consistent with septic arthritis or inflammatory arthritis.  5:09 PM Reassessment performed. Patient appears stable.  Imaging personally visualized and interpreted including: X-ray of the knee agree tricompartmental arthritic changes.    Reviewed imaging results: DVT study no signs of clot with limitations due to habitus.  Reassuring as patient is low clinical risk.  Reviewed pertinent lab work and imaging with patient at bedside. Questions answered.   Most current vital signs reviewed and are as follows: BP (!) 175/66 (BP Location: Right Arm)   Pulse 80   Temp 98.6 F (37 C) (Oral)   Resp 20   Ht 5\' 6"  (1.676 m)   Wt (!) 149.5 kg   SpO2 96%   BMI 53.20 kg/m   Plan: Discharge to home.   Prescriptions written for: Meloxicam  Other home care instructions discussed: RICE protocol, avoid other NSAIDs while taking meloxicam  ED return instructions discussed: Return with worsening pain, swelling, fever  Follow-up instructions discussed: Patient encouraged to follow-up with their PCP/orthopedic referral in 1 week.                                 Medical Decision Making Amount and/or Complexity of Data Reviewed Radiology: ordered.   Patient with knee pain.  Previously improved with activity, but more constant  recently.  Pain is in the knee.  No calf or thigh pain.  Patient was sent from urgent care to rule out DVT.  Clinically low concern for DVT.  Ultrasound was negative with some limitations due to habitus.  X-ray demonstrates arthritis is noted.  Suspect that this is the most likely cause of the patient's symptoms.  Low concern for gout or other inflammatory arthritis.  Will  trial meloxicam and outpatient follow-up with orthopedics if not improving.        Final Clinical Impression(s) / ED Diagnoses Final diagnoses:  Acute pain of right knee  Primary osteoarthritis of right knee    Rx / DC Orders ED Discharge Orders          Ordered    meloxicam (MOBIC) 7.5 MG tablet  Daily        12/05/23 1700              Lyna Sandhoff, PA-C 12/05/23 1711    Merdis Stalling, MD 12/05/23 1751

## 2024-01-28 ENCOUNTER — Ambulatory Visit: Admitting: Internal Medicine

## 2024-03-17 ENCOUNTER — Ambulatory Visit: Admitting: Internal Medicine

## 2024-03-26 DIAGNOSIS — G4733 Obstructive sleep apnea (adult) (pediatric): Secondary | ICD-10-CM | POA: Diagnosis not present

## 2024-03-27 ENCOUNTER — Ambulatory Visit (INDEPENDENT_AMBULATORY_CARE_PROVIDER_SITE_OTHER): Admitting: Internal Medicine

## 2024-03-27 ENCOUNTER — Encounter: Payer: Self-pay | Admitting: Internal Medicine

## 2024-03-27 VITALS — BP 134/80 | HR 77 | Ht 66.0 in | Wt 337.0 lb

## 2024-03-27 DIAGNOSIS — Z0000 Encounter for general adult medical examination without abnormal findings: Secondary | ICD-10-CM | POA: Diagnosis not present

## 2024-03-27 DIAGNOSIS — R809 Proteinuria, unspecified: Secondary | ICD-10-CM

## 2024-03-27 DIAGNOSIS — E113313 Type 2 diabetes mellitus with moderate nonproliferative diabetic retinopathy with macular edema, bilateral: Secondary | ICD-10-CM | POA: Diagnosis not present

## 2024-03-27 DIAGNOSIS — M170 Bilateral primary osteoarthritis of knee: Secondary | ICD-10-CM | POA: Diagnosis not present

## 2024-03-27 DIAGNOSIS — I10 Essential (primary) hypertension: Secondary | ICD-10-CM | POA: Diagnosis not present

## 2024-03-27 DIAGNOSIS — E1165 Type 2 diabetes mellitus with hyperglycemia: Secondary | ICD-10-CM | POA: Diagnosis not present

## 2024-03-27 DIAGNOSIS — Z794 Long term (current) use of insulin: Secondary | ICD-10-CM | POA: Diagnosis not present

## 2024-03-27 DIAGNOSIS — E1129 Type 2 diabetes mellitus with other diabetic kidney complication: Secondary | ICD-10-CM | POA: Diagnosis not present

## 2024-03-27 DIAGNOSIS — G4733 Obstructive sleep apnea (adult) (pediatric): Secondary | ICD-10-CM | POA: Diagnosis not present

## 2024-03-27 DIAGNOSIS — E785 Hyperlipidemia, unspecified: Secondary | ICD-10-CM

## 2024-03-27 LAB — POCT GLYCOSYLATED HEMOGLOBIN (HGB A1C): Hemoglobin A1C: 8.5 % — AB (ref 4.0–5.6)

## 2024-03-27 LAB — POCT GLUCOSE (DEVICE FOR HOME USE): Glucose Fasting, POC: 222 mg/dL — AB (ref 70–99)

## 2024-03-27 MED ORDER — TIRZEPATIDE 5 MG/0.5ML ~~LOC~~ SOAJ: 5.0000 mg | SUBCUTANEOUS | 3 refills | Status: DC

## 2024-03-27 MED ORDER — HUMALOG KWIKPEN 200 UNIT/ML ~~LOC~~ SOPN: 16.0000 [IU] | PEN_INJECTOR | Freq: Three times a day (TID) | SUBCUTANEOUS | 2 refills | Status: AC

## 2024-03-27 MED ORDER — TOUJEO SOLOSTAR 300 UNIT/ML ~~LOC~~ SOPN: 40.0000 [IU] | PEN_INJECTOR | Freq: Every day | SUBCUTANEOUS | 2 refills | Status: AC

## 2024-03-27 MED ORDER — INSULIN PEN NEEDLE 31G X 5 MM MISC: 1.0000 | Freq: Four times a day (QID) | 3 refills | Status: AC

## 2024-03-27 MED ORDER — FREESTYLE LIBRE 3 PLUS SENSOR MISC: 0 refills | Status: AC

## 2024-03-27 MED ORDER — TIRZEPATIDE 2.5 MG/0.5ML ~~LOC~~ SOAJ: 2.5000 mg | SUBCUTANEOUS | Status: DC

## 2024-03-27 NOTE — Progress Notes (Signed)
 Name: Amy [name]  Age/ Sex: 58 y.o., female   MRN/ DOB: [id number], [birth year]     PCP: [name], MD   Reason for Endocrinology Evaluation: Type 2 Diabetes Mellitus  Initial Endocrine Consultative Visit: 05/05/2019    PATIENT IDENTIFIER: Amy [name] is a 58 y.o. female with a past medical history of HTN,T2DM, OSA and anemia. The patient has followed with Endocrinology clinic since 05/05/2019 for consultative assistance with management of her diabetes.    DIABETIC HISTORY:  Ms. Geers was diagnosed with DM in 2010, metformin caused diarrhea Has been on insulin  for years. Her hemoglobin A1c has ranged from  7.9% in 2011, peaking at 12.0% in 2019.  Transitioned care from Dr. Balan in 2020 . Her initial A1c at our clinic was 11.0% . We started her on Ozempic  and adjusted MDI regimen but was lost to follow up for 2 years prior to her return again in 03/2021   She moved here to be near her daughter while going through college but daughter has graduated and she is working in [place].    The patient was lost to follow-up from 2022 until her return in 03/2024, she was off Ozempic  due to lack of refills   SUBJECTIVE:   During the last visit (04/18/2021): A1c 11.0% . Started Ozempic , adjusted MDI regimen   Today (03/27/2024): Amy [name]  is here for a follow up on diabetes management. She has NOT been to our clinic in almost 3 years . 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 Denies nausea or vomiting  Denies constipation or diarrhea  Has arthritis of the knees   HOME DIABETES REGIMEN:  Toujeo  40 units daily  Humalog  16 units per meal     Statin: yes ACE-I/ARB: yes Prior Diabetic Education: no   METER DOWNLOAD SUMMARY: Did not bring     DIABETIC COMPLICATIONS: Microvascular  complications:   Denies: CKD, neuropathy, retinopathy  Last Eye Exam: Completed 2021  Macrovascular complications:   Denies: CAD, CVA, PVD   HISTORY:  Past Medical History:  Past Medical History:  Diagnosis Date   Anemia    Diabetes mellitus without complication (HCC)    HLD (hyperlipidemia)    Hypertension    OSA (obstructive sleep apnea)    APAP S10 4-20 obt 2021   Sleep apnea    Past Surgical History:  Past Surgical History:  Procedure Laterality Date   APPENDECTOMY  2003   CESAREAN SECTION  1995   COLONOSCOPY WITH PROPOFOL  N/A 12/13/2017   Procedure: COLONOSCOPY WITH PROPOFOL ;  Surgeon: [name], MD;  Location: [facility];  Service: Gastroenterology;  Laterality: N/A;   HYSTEROSCOPY WITH D & C N/A 05/19/2021   Procedure: DILATATION AND CURETTAGE, HYSTEROSCOPY WITH MYOSURE;  Surgeon: [name], MD;  Location: [facility];  Service: Gynecology;  Laterality: N/A;   REDUCTION MAMMAPLASTY Bilateral 2010   Social History:  reports that she has never smoked. She has never used smokeless tobacco. She reports that she does not currently use alcohol. She reports that she does not use drugs. Family History:  Family History  Problem Relation Age of Onset   Aneurysm Father    Ovarian cancer Maternal Aunt    Colon cancer Cousin    Colon cancer Cousin    Diabetes Brother    Prostate cancer  Neg Hx    Pancreatic cancer Neg Hx    Cancer - Colon Neg Hx    Endometrial cancer Neg Hx      HOME MEDICATIONS: Allergies as of 03/27/2024       Reactions   Lisinopril Swelling        Medication List        Accurate as of March 27, 2024  1:51 PM. If you have any questions, ask your nurse or doctor.          STOP taking these medications    Ozempic  (0.25 or 0.5 MG/DOSE) 2 MG/3ML Sopn Generic drug: Semaglutide (0.25 or 0.5MG /DOS) Stopped by: Donell PARAS Garlon Tuggle       TAKE these medications    aspirin EC 81 MG tablet Take 81 mg by mouth every other day.  Swallow whole.   atorvastatin  40 MG tablet Commonly known as: LIPITOR TAKE 1 TABLET BY MOUTH  DAILY   ferrous sulfate 325 (65 FE) MG tablet Take 325 mg by mouth daily with breakfast.   FreeStyle Libre 3 Plus Sensor Misc Change sensor every 15 days. Started by: Donell PARAS Holden Draughon   HumaLOG  KwikPen 100 UNIT/ML KwikPen Generic drug: insulin  lispro INJECT SUBCUTANEOUSLY 12 UNITS 3 TIMES DAILY   Insulin  Pen Needle 31G X 5 MM Misc 1 Device by Does not apply route in the morning, at noon, in the evening, and at bedtime.   irbesartan -hydrochlorothiazide  300-12.5 MG tablet Commonly known as: AVALIDE TAKE 1 TABLET BY MOUTH  DAILY   meloxicam  7.5 MG tablet Commonly known as: Mobic  Take 1 tablet (7.5 mg total) by mouth daily.   tirzepatide  5 MG/0.5ML Pen Commonly known as: MOUNJARO  Inject 5 mg into the skin once a week. Started by: Janiah Devinney J Latoya Maulding   tirzepatide  2.5 MG/0.5ML Pen Commonly known as: MOUNJARO  Inject 2.5 mg into the skin once a week. Started by: Donell PARAS Anavi Branscum   Toujeo  SoloStar 300 UNIT/ML Solostar Pen Generic drug: insulin  glargine (1 Unit Dial ) INJECT 40 UNITS  SUBCUTANEOUSLY DAILY IN THE AFTERNOON   Vitamin D3 25 MCG (1000 UT) Caps Take 1,000 Units by mouth daily.         OBJECTIVE:   Vital Signs: BP 134/80 (BP Location: Left Wrist, Cuff Size: Large)   Pulse 77   Ht 5' 6 (1.676 m)   Wt (!) 337 lb (152.9 kg)   SpO2 99%   BMI 54.39 kg/m   Wt Readings from Last 3 Encounters:  03/27/24 (!) 337 lb (152.9 kg)  12/05/23 (!) 329 lb 9.4 oz (149.5 kg)  06/18/21 (!) 329 lb 8 oz (149.5 kg)     Exam: General: Pt appears well and is in NAD  Lungs: Clear with good BS bilat with no rales, rhonchi, or wheezes  Heart: RRR   Extremities: Trace pretibial edema.   Neuro: MS is good with appropriate affect, pt is alert and Ox3    DM foot exam: 03/27/2024  The skin of the feet is intact without sores or ulcerations. The pedal pulses unable to  palpate due to edema The sensation is intact to a screening 5.07, 10 gram monofilament bilaterally          DATA REVIEWED:  Lab Results  Component Value Date   HGBA1C 8.5 (A) 03/27/2024   HGBA1C 10.8 (H) 05/15/2021   HGBA1C 11.7 (A) 04/18/2021   11/17/2023 A1c 12.0% BUN 15 Creatinine 0.740 GFR 80 HDL 37 LDL 120 Triglycerides 124  ASSESSMENT / PLAN / RECOMMENDATIONS:   1) Type  2 Diabetes Mellitus, Poorly controlled, With retinal pathic complications and microalbuminuria- Most recent A1c of 8.5 %. Goal A1c < 7.0 %.     -The patient has not been to our clinic in almost 3 years -On her last visit here she was on Ozempic , but she has not been on it for a while due to lack of prescription renewal -Patient is interested in GLP-1 agonist, I did recommend Mounjaro , caution against GI side effects, she was provided with number for sample pens of 2.5 mg -She is on freestyle libre 3, as this is no CGM covered by her insurance -Discussed the importance of glucose checks before each meal and using correction scale as needed - she is intolerant to Metformin     MEDICATIONS: Start Mounjaro  2.5 mg weekly for 4 weeks, then increase to 5 mg weekly Continue Toujeo  40  units once a day  Continue Humalog  16 units with each meal  Start CF: Humalog  (BG-130/30) TIDQAC   EDUCATION / INSTRUCTIONS: BG monitoring instructions: Patient is instructed to check her blood sugars 3 times a day, before meals . Call [facility] if: BG persistently < 70 I reviewed the Rule of 15 for the treatment of hypoglycemia in detail with the patient. Literature supplied.   2) Diabetic complications:  Eye: Does not have known diabetic retinopathy.  Neuro/ Feet: Does not have known diabetic peripheral neuropathy .  Renal: Patient does not have known baseline CKD. She   is  on an ACEI/ARB at present.   3) Dyslipidemia :   - LDL above goal , most likely due to non-compliance issues  - I have  switched simvastatin to atorvastatin  2022   Medication  Continue atorvastatin  40 mg daily   4) Microalbuminuria:  -Patient on ARB - Will emphasize the importance of optimizing glucose control -Will continue to monitor  F/U in 3 months     Signed electronically by: Stefano Redgie Butts, MD  [facility]  [facility] [address] [place] [place], [place] [address] Phone: [phone] FAX: [phone]   CC: [name], MD [address] [place] [address] Phone: [phone]  Fax: [phone]  Return to Endocrinology clinic as below: Future Appointments  Date Time Provider Department Center  06/27/2024  1:40 PM Dalton Mille, Donell Redgie, MD [id number] None

## 2024-03-27 NOTE — Patient Instructions (Signed)
-   Start Mounjaro  2.5 mg weekly for 4 weeks, than increase to 5 mg weekly  - Continue Toujeo  40 units once a day  - Humalog  16 units with each meal  -Humalog  correctional insulin : ADD extra units on insulin  to your meal-time Humalog  dose if your blood sugars are higher than 160. Use the scale below to help guide you:   Blood sugar before meal Number of units to inject  Less than 160 0 unit  161 -  190 1 units  191 -  220 2 units  221 -  250 3 units  251 -  280 4 units  281 -  310 5 units  311 -  340 6 units  341 -  370 7 units       HOW TO TREAT LOW BLOOD SUGARS (Blood sugar LESS THAN 70 MG/DL) Please follow the RULE OF 15 for the treatment of hypoglycemia treatment (when your (blood sugars are less than 70 mg/dL)   STEP 1: Take 15 grams of carbohydrates when your blood sugar is low, which includes:  3-4 GLUCOSE TABS  OR 3-4 OZ OF JUICE OR REGULAR SODA OR ONE TUBE OF GLUCOSE GEL    STEP 2: RECHECK blood sugar in 15 MINUTES STEP 3: If your blood sugar is still low at the 15 minute recheck --> then, go back to STEP 1 and treat AGAIN with another 15 grams of carbohydrates.

## 2024-03-28 ENCOUNTER — Ambulatory Visit: Payer: Self-pay | Admitting: Internal Medicine

## 2024-03-28 DIAGNOSIS — E1129 Type 2 diabetes mellitus with other diabetic kidney complication: Secondary | ICD-10-CM | POA: Insufficient documentation

## 2024-03-28 LAB — MICROALBUMIN / CREATININE URINE RATIO
Creatinine, Urine: 145 mg/dL (ref 20–275)
Microalb Creat Ratio: 94 mg/g{creat} — ABNORMAL HIGH (ref <30)
Microalb, Ur: 13.7 mg/dL

## 2024-04-13 DIAGNOSIS — Z86018 Personal history of other benign neoplasm: Secondary | ICD-10-CM | POA: Diagnosis not present

## 2024-04-25 NOTE — Progress Notes (Signed)
 [facility] - Clinic Note  05/01/2024   CHIEF COMPLAINT Patient presents for Retina Evaluation  HISTORY OF PRESENT ILLNESS: Amy [name] is a 58 y.o. female who presents to the clinic today for:  HPI     Retina Evaluation   In both eyes.  This started 11 months ago.  Associated Symptoms Glare.  Negative for Flashes, Floaters, Distortion, Blind Spot, Pain, Redness, Photophobia, Trauma, Scalp Tenderness, Jaw Claudication, Shoulder/Hip pain, Fever, Weight Loss and Fatigue.  Context:  distance vision and night driving.  Treatments tried include no treatments.  I, the attending physician,  performed the HPI with the patient and updated documentation appropriately.        Comments   Pt states treatment was delayed due to insurance issues. Pt states vision has not been good and feels her glasses have never been right. Pt denies FOL/new or changing floaters/pain. Pt uses Ats prn, not often. A1c=8.0, Aug. '25 -mounjaro  has been helping stabilize A1c. BS=not monitored, libre popping off.      Last edited by [name], MD on 05/01/2024  5:14 PM.     Referring physician: [name], MD [address] [place],  [place] [address]  HISTORICAL INFORMATION:  Selected notes from the MEDICAL RECORD NUMBER Referred by Dr. [name] for concern of DME LEE:  Ocular Hx- PMH-   CURRENT MEDICATIONS: No current outpatient medications on file. (Ophthalmic Drugs)   No current facility-administered medications for this visit. (Ophthalmic Drugs)   Current Outpatient Medications (Other)  Medication Sig   aspirin EC 81 MG tablet Take 81 mg by mouth every other day. Swallow whole.   atorvastatin  (LIPITOR) 40 MG tablet TAKE 1 TABLET BY MOUTH  DAILY   Cholecalciferol (VITAMIN D3) 25 MCG (1000 UT) CAPS Take 1,000 Units by mouth daily.   Continuous Glucose Sensor (FREESTYLE LIBRE 3 PLUS SENSOR) MISC Change sensor every 15 days.   ferrous sulfate 325 (65 FE)  MG tablet Take 325 mg by mouth daily with breakfast.   insulin  glargine, 1 Unit Dial , (TOUJEO  SOLOSTAR) 300 UNIT/ML Solostar Pen Inject 40 Units into the skin daily in the afternoon.   insulin  lispro (HUMALOG  KWIKPEN) 200 UNIT/ML KwikPen Inject 16-26 Units into the skin 3 (three) times daily.   Insulin  Pen Needle 31G X 5 MM MISC 1 Device by Does not apply route in the morning, at noon, in the evening, and at bedtime.   irbesartan -hydrochlorothiazide  (AVALIDE) 300-12.5 MG tablet TAKE 1 TABLET BY MOUTH  DAILY   meloxicam  (MOBIC ) 7.5 MG tablet Take 1 tablet (7.5 mg total) by mouth daily.   tirzepatide  (MOUNJARO ) 2.5 MG/0.5ML Pen Inject 2.5 mg into the skin once a week.   tirzepatide  (MOUNJARO ) 5 MG/0.5ML Pen Inject 5 mg into the skin once a week.   No current facility-administered medications for this visit. (Other)   REVIEW OF SYSTEMS: ROS   Positive for: Endocrine, Eyes, Respiratory Last edited by Elnor Avelina RAMAN, COT on 05/01/2024  2:23 PM.      ALLERGIES Allergies  Allergen Reactions   Lisinopril Swelling   PAST MEDICAL HISTORY Past Medical History:  Diagnosis Date   Anemia    Diabetes mellitus without complication (HCC)    HLD (hyperlipidemia)    Hypertension    OSA (obstructive sleep apnea)    APAP S10 4-20 obt 2021   Sleep apnea    Past Surgical History:  Procedure Laterality Date   APPENDECTOMY  2003   CESAREAN SECTION  1995  COLONOSCOPY WITH PROPOFOL  N/A 12/13/2017   Procedure: COLONOSCOPY WITH PROPOFOL ;  Surgeon: [name], MD;  Location: [facility];  Service: Gastroenterology;  Laterality: N/A;   HYSTEROSCOPY WITH D & C N/A 05/19/2021   Procedure: DILATATION AND CURETTAGE, HYSTEROSCOPY WITH MYOSURE;  Surgeon: [name], MD;  Location: [facility];  Service: Gynecology;  Laterality: N/A;   REDUCTION MAMMAPLASTY Bilateral 2010   FAMILY HISTORY Family History  Problem Relation Age of Onset   Aneurysm Father    Ovarian cancer Maternal Aunt    Colon  cancer Cousin    Colon cancer Cousin    Diabetes Brother    Prostate cancer Neg Hx    Pancreatic cancer Neg Hx    Cancer - Colon Neg Hx    Endometrial cancer Neg Hx    SOCIAL HISTORY Social History   Tobacco Use   Smoking status: Never   Smokeless tobacco: Never  Vaping Use   Vaping status: Never Used  Substance Use Topics   Alcohol use: Not Currently    Comment: rare, 2 a year   Drug use: Never       OPHTHALMIC EXAM:  Base Eye Exam     Visual Acuity (Snellen - Linear)       Right Left   Dist [place] 20/60 +1 20/40 -2   Dist ph [place] 20/40 -2 NI    Correction: Glasses         Tonometry (Tonopen, 2:37 PM)       Right Left   Pressure 21 22         Pupils       Pupils Dark Light Shape React APD   Right PERRL 3 2 Round Minimal None   Left PERRL 3 2 Round Minimal None         Visual Fields       Left Right    Full Full         Extraocular Movement       Right Left    Full, Ortho Full, Ortho         Neuro/Psych     Oriented x3: Yes         Dilation     Both eyes: 1.0% Mydriacyl, 2.5% Phenylephrine @ 2:37 PM           Slit Lamp and Fundus Exam     Slit Lamp Exam       Right Left   Lids/Lashes Dermatochalasis - upper lid Dermatochalasis - upper lid   Conjunctiva/Sclera Melanosis Melanosis   Cornea trace tear film debris arcus, trace tear film debris   Anterior Chamber deep and clear deep and clear   Iris Round and dilated, No NVI Round and dilated, No NVI   Lens 2+ Nuclear sclerosis, 2+ Cortical cataract, 1+ Posterior subcapsular cataract 2+ Cortical cataract, +Vacuoles, 2+ Nuclear sclerosis   Anterior Vitreous syneresis syneresis         Fundus Exam       Right Left   Disc Pink and Sharp, mild PPP Pink and Sharp, +cupping   C/D Ratio 0.6 0.65   Macula Blunted foveal reflex, scattered MA/DBH, edema and exudates greatest superior and nasal mac-- worse Blunted foveal reflex, scattered MA/DBH, edema and punctate exudates  greatest superior mac-- worse   Vessels attenuated, Tortuous, mild AV crossing changes, no NV attenuated, Tortuous, mild copper wiring, mild AV crossing changes, no NV   Periphery Attached, scattered MA / DBH greatest nasal to disc Attached, rare MA /  DBH greatest nasal to disc, focal CWS inferior to disc           Refraction     Wearing Rx       Sphere Cylinder Axis Add   Right -2.50 +0.50 171 +2.25   Left -3.00 +1.00 013 +2.25         Manifest Refraction (Subjective)       Sphere Cylinder Axis Dist VA   Right -2.50 +0.50 171 NI   Left -2.50 +1.00 013 NI           IMAGING AND PROCEDURES  Imaging and Procedures for 05/01/2024  OCT, Retina - OU - Both Eyes       Right Eye Quality was good. Central Foveal Thickness: 472. Progression has worsened. Findings include intraretinal hyper-reflective material, intraretinal fluid, vitreomacular adhesion (Interval increase in scattered IRF/IRHM greatest SN mac-- now extending through fovea with + SRF).   Left Eye Quality was good. Central Foveal Thickness: 580. Progression has worsened. Findings include abnormal foveal contour, intraretinal hyper-reflective material, intraretinal fluid, vitreomacular adhesion (Interval increase in scattered IRF/IRHM greatest superior mac with extension through fovea and + SRF).   Notes *Images captured and stored on drive  Diagnosis / Impression:  +DME OU OD: Interval increase in scattered IRF/IRHM greatest SN mac-- now extending through fovea with + SRF OS: Interval increase in scattered IRF/IRHM greatest superior mac with extension through fovea and + SRF  Clinical management:  See below  Abbreviations: NFP - Normal foveal profile. CME - cystoid macular edema. PED - pigment epithelial detachment. IRF - intraretinal fluid. SRF - subretinal fluid. EZ - ellipsoid zone. ERM - epiretinal membrane. ORA - outer retinal atrophy. ORT - outer retinal tubulation. SRHM - subretinal hyper-reflective  material. IRHM - intraretinal hyper-reflective material      Intravitreal Injection, Pharmacologic Agent - OS - Left Eye       Time Out 05/01/2024. 3:31 PM. Confirmed correct patient, procedure, site, and patient consented.   Anesthesia Topical anesthesia was used. Anesthetic medications included Lidocaine  2%, Proparacaine 0.5%.   Procedure Preparation included 5% betadine to ocular surface, eyelid speculum. A (32g) needle was used.   Injection: 1.25 mg Bevacizumab  1.25mg /0.71ml   Route: Intravitreal, Site: Left Eye   NDC: 49757-939-98, Lot: [id number], Expiration date: 05/14/2024   Post-op Post injection exam found visual acuity of at least counting fingers. The patient tolerated the procedure well. There were no complications. The patient received written and verbal post procedure care education.            ASSESSMENT/PLAN:   ICD-10-CM   1. Moderate nonproliferative diabetic retinopathy of both eyes with macular edema associated with type 2 diabetes mellitus (HCC)  E11.3313 OCT, Retina - OU - Both Eyes    Intravitreal Injection, Pharmacologic Agent - OS - Left Eye    Bevacizumab  (AVASTIN ) SOLN 1.25 mg    CANCELED: Intravitreal Injection, Pharmacologic Agent - OS - Left Eye    2. Long term (current) use of oral hypoglycemic drugs  Z79.84     3. Long-term (current) use of injectable non-insulin  antidiabetic drugs  Z79.85     4. Essential hypertension  I10     5. Hypertensive retinopathy of both eyes  H35.033     6. Combined forms of age-related cataract of both eyes  H25.813      **pt lost to f/u from 10.07.24 to 09.08.25 due to insurance issues**  1-3. Moderate Non-proliferative diabetic retinopathy, both eyes  - s/p IVA OS #1 (05/31/2023)  -  last A1c 8.4 (08.2025 - per pt), 10.8 in September 2022 - The incidence, risk factors for progression, natural history and treatment options for diabetic retinopathy were discussed with patient.   - The need for close monitoring of  blood glucose, blood pressure, and serum lipids, avoiding cigarette or any type of tobacco, and the need for long term follow up was also discussed with patient. - exam scattered MA/DBH and exudates OU - FA (10.07.24) shows OD: Scattered leaking MA mostly posterior, No NV; OS: Focal clusters of leaking MA posteriorly greatest superior macula, no NV - OCT shows OD: Interval increase in scattered IRF/IRHM greatest SN mac-- now extending through fovea with + SRF, OS: Interval increase in scattered IRF/IRHM greatest superior mac with extension through fovea and + SRF - BCVA 20/40 OU - decreased from 20/20 OU - recommend IVA OS #2 today (09.08.25) for DME -- unable to do bilateral injxns today - pt wishes to proceed with injection - RBA of procedure discussed, questions answered - IVA informed consent obtained and signed, 10.07.24 - see procedure note - f/u 09.11.25 for DFE, OCT, possible IVA OD  - f/u in 4 wks -- DFE/OCT, possible injection(s)  4,5. Hypertensive retinopathy OU - discussed importance of tight BP control - monitor  6. Mixed Cataract OU - The symptoms of cataract, surgical options, and treatments and risks were discussed with patient. - discussed diagnosis and progression - monitor  Ophthalmic Meds Ordered this visit:  Meds ordered this encounter  Medications   Bevacizumab  (AVASTIN ) SOLN 1.25 mg     Return in about 4 weeks (around 05/29/2024) for f/u, 09.11.25 possible IVA OU then in 4 weeks NPDR, DFE, OCT, Possible, IVA, OU.  There are no Patient Instructions on file for this visit.  Explained the diagnoses, plan, and follow up with the patient and they expressed understanding.  Patient expressed understanding of the importance of proper follow up care.   This document serves as a record of services personally performed by Redell JUDITHANN Hans, MD, PhD. It was created on their behalf by Avelina Pereyra, COA an ophthalmic technician. The creation of this record is the provider's  dictation and/or activities during the visit.   Electronically signed by: Avelina GORMAN Pereyra, COT  05/04/24  2:06 PM   This document serves as a record of services personally performed by Redell JUDITHANN Hans, MD, PhD. It was created on their behalf by Wanda GEANNIE Keens, COT an ophthalmic technician. The creation of this record is the provider's dictation and/or activities during the visit.    Electronically signed by:  Wanda GEANNIE Keens, COT  05/04/24 2:06 PM  Redell JUDITHANN Hans, M.D., Ph.D. Diseases & Surgery of the Retina and Vitreous [facility] 05/01/2024  I have reviewed the above documentation for accuracy and completeness, and I agree with the above. Redell JUDITHANN Hans, M.D., Ph.D. 05/04/24 2:08 PM   Abbreviations: M myopia (nearsighted); A astigmatism; H hyperopia (farsighted); P presbyopia; Mrx spectacle prescription;  CTL contact lenses; OD right eye; OS left eye; OU both eyes  XT exotropia; ET esotropia; PEK punctate epithelial keratitis; PEE punctate epithelial erosions; DES dry eye syndrome; MGD meibomian gland dysfunction; ATs artificial tears; PFAT's preservative free artificial tears; NSC nuclear sclerotic cataract; PSC posterior subcapsular cataract; ERM epi-retinal membrane; PVD posterior vitreous detachment; RD retinal detachment; DM diabetes mellitus; DR diabetic retinopathy; NPDR non-proliferative diabetic retinopathy; PDR proliferative diabetic retinopathy; CSME clinically significant macular edema; DME diabetic macular edema; dbh dot blot hemorrhages; CWS  cotton wool spot; POAG primary open angle glaucoma; C/D cup-to-disc ratio; HVF humphrey visual field; GVF goldmann visual field; OCT optical coherence tomography; IOP intraocular pressure; BRVO Branch retinal vein occlusion; CRVO central retinal vein occlusion; CRAO central retinal artery occlusion; BRAO branch retinal artery occlusion; RT retinal tear; SB scleral buckle; PPV pars plana vitrectomy; VH Vitreous  hemorrhage; PRP panretinal laser photocoagulation; IVK intravitreal kenalog; VMT vitreomacular traction; MH Macular hole;  NVD neovascularization of the disc; NVE neovascularization elsewhere; AREDS age related eye disease study; ARMD age related macular degeneration; POAG primary open angle glaucoma; EBMD epithelial/anterior basement membrane dystrophy; ACIOL anterior chamber intraocular lens; IOL intraocular lens; PCIOL posterior chamber intraocular lens; Phaco/IOL phacoemulsification with intraocular lens placement; PRK photorefractive keratectomy; LASIK laser assisted in situ keratomileusis; HTN hypertension; DM diabetes mellitus; COPD chronic obstructive pulmonary disease

## 2024-05-01 ENCOUNTER — Ambulatory Visit (INDEPENDENT_AMBULATORY_CARE_PROVIDER_SITE_OTHER): Admitting: Ophthalmology

## 2024-05-01 ENCOUNTER — Encounter (INDEPENDENT_AMBULATORY_CARE_PROVIDER_SITE_OTHER): Payer: Self-pay | Admitting: Ophthalmology

## 2024-05-01 DIAGNOSIS — H25813 Combined forms of age-related cataract, bilateral: Secondary | ICD-10-CM | POA: Diagnosis not present

## 2024-05-01 DIAGNOSIS — Z7984 Long term (current) use of oral hypoglycemic drugs: Secondary | ICD-10-CM | POA: Diagnosis not present

## 2024-05-01 DIAGNOSIS — H35033 Hypertensive retinopathy, bilateral: Secondary | ICD-10-CM

## 2024-05-01 DIAGNOSIS — I10 Essential (primary) hypertension: Secondary | ICD-10-CM | POA: Diagnosis not present

## 2024-05-01 DIAGNOSIS — Z7985 Long-term (current) use of injectable non-insulin antidiabetic drugs: Secondary | ICD-10-CM | POA: Diagnosis not present

## 2024-05-01 DIAGNOSIS — E113313 Type 2 diabetes mellitus with moderate nonproliferative diabetic retinopathy with macular edema, bilateral: Secondary | ICD-10-CM

## 2024-05-01 MED ORDER — BEVACIZUMAB CHEMO INJECTION 1.25MG/0.05ML SYRINGE FOR KALEIDOSCOPE
1.2500 mg | INTRAVITREAL | Status: AC | PRN
  Administered 2024-05-01: 1.25 mg via INTRAVITREAL

## 2024-05-02 ENCOUNTER — Encounter: Payer: Self-pay | Admitting: Internal Medicine

## 2024-05-03 NOTE — Progress Notes (Signed)
 [facility] - Clinic Note  05/04/2024   CHIEF COMPLAINT Patient presents for Retina Follow Up  HISTORY OF PRESENT ILLNESS: Amy [name] is a 58 y.o. female who presents to the clinic today for:  HPI     Retina Follow Up   Patient presents with  Diabetic Retinopathy.  In both eyes.  This started 3 days ago.  I, the attending physician,  performed the HPI with the patient and updated documentation appropriately.        Comments   Patient here for 3 days follow up for NPDR OU. Patient states vision doing good. No eye pain.       Last edited by [name], MD on 05/04/2024  1:59 PM.    Pt states   Referring physician: [name], MD [address] [place],  [place] [address]  HISTORICAL INFORMATION:  Selected notes from the MEDICAL RECORD NUMBER Referred by Dr. [name] for concern of DME LEE:  Ocular Hx- PMH-   CURRENT MEDICATIONS: No current outpatient medications on file. (Ophthalmic Drugs)   No current facility-administered medications for this visit. (Ophthalmic Drugs)   Current Outpatient Medications (Other)  Medication Sig   aspirin EC 81 MG tablet Take 81 mg by mouth every other day. Swallow whole.   atorvastatin  (LIPITOR) 40 MG tablet TAKE 1 TABLET BY MOUTH  DAILY   Cholecalciferol (VITAMIN D3) 25 MCG (1000 UT) CAPS Take 1,000 Units by mouth daily.   Continuous Glucose Sensor (FREESTYLE LIBRE 3 PLUS SENSOR) MISC Change sensor every 15 days.   insulin  glargine, 1 Unit Dial , (TOUJEO  SOLOSTAR) 300 UNIT/ML Solostar Pen Inject 40 Units into the skin daily in the afternoon.   Insulin  Pen Needle 31G X 5 MM MISC 1 Device by Does not apply route in the morning, at noon, in the evening, and at bedtime.   irbesartan -hydrochlorothiazide  (AVALIDE) 300-12.5 MG tablet TAKE 1 TABLET BY MOUTH  DAILY   meloxicam  (MOBIC ) 7.5 MG tablet Take 1 tablet (7.5 mg total) by mouth daily.   tirzepatide  (MOUNJARO ) 5 MG/0.5ML Pen Inject 5 mg  into the skin once a week.   ferrous sulfate 325 (65 FE) MG tablet Take 325 mg by mouth daily with breakfast.   insulin  lispro (HUMALOG  KWIKPEN) 200 UNIT/ML KwikPen Inject 16-26 Units into the skin 3 (three) times daily.   tirzepatide  (MOUNJARO ) 2.5 MG/0.5ML Pen Inject 2.5 mg into the skin once a week.   No current facility-administered medications for this visit. (Other)   REVIEW OF SYSTEMS: ROS   Positive for: Endocrine, Eyes, Respiratory Last edited by [name], COA on 05/04/2024  9:23 AM.     ALLERGIES Allergies  Allergen Reactions   Lisinopril Swelling   PAST MEDICAL HISTORY Past Medical History:  Diagnosis Date   Anemia    Diabetes mellitus without complication (HCC)    HLD (hyperlipidemia)    Hypertension    OSA (obstructive sleep apnea)    APAP S10 4-20 obt 2021   Sleep apnea    Past Surgical History:  Procedure Laterality Date   APPENDECTOMY  2003   CESAREAN SECTION  1995   COLONOSCOPY WITH PROPOFOL  N/A 12/13/2017   Procedure: COLONOSCOPY WITH PROPOFOL ;  Surgeon: [name], MD;  Location: [facility];  Service: Gastroenterology;  Laterality: N/A;   HYSTEROSCOPY WITH D & C N/A 05/19/2021   Procedure: DILATATION AND CURETTAGE, HYSTEROSCOPY WITH MYOSURE;  Surgeon: [name], MD;  Location: [facility];  Service: Gynecology;  Laterality:  N/A;   REDUCTION MAMMAPLASTY Bilateral 2010   FAMILY HISTORY Family History  Problem Relation Age of Onset   Aneurysm Father    Ovarian cancer Maternal Aunt    Colon cancer Cousin    Colon cancer Cousin    Diabetes Brother    Prostate cancer Neg Hx    Pancreatic cancer Neg Hx    Cancer - Colon Neg Hx    Endometrial cancer Neg Hx    SOCIAL HISTORY Social History   Tobacco Use   Smoking status: Never   Smokeless tobacco: Never  Vaping Use   Vaping status: Never Used  Substance Use Topics   Alcohol use: Not Currently    Comment: rare, 2 a year   Drug use: Never       OPHTHALMIC EXAM:  Base  Eye Exam     Visual Acuity (Snellen - Linear)       Right Left   Dist cc 20/60 +1 20/40 -1   Dist ph cc NI NI    Correction: Glasses         Tonometry (Tonopen, 9:21 AM)       Right Left   Pressure 19 22         Pupils       Dark Light Shape React APD   Right 3 2 Round Minimal None   Left 3 2 Round Minimal None         Visual Fields (Counting fingers)       Left Right    Full Full         Extraocular Movement       Right Left    Full, Ortho Full, Ortho         Neuro/Psych     Oriented x3: Yes   Mood/Affect: Normal         Dilation     Both eyes: 1.0% Mydriacyl, 2.5% Phenylephrine @ 9:21 AM           Slit Lamp and Fundus Exam     Slit Lamp Exam       Right Left   Lids/Lashes Dermatochalasis - upper lid Dermatochalasis - upper lid   Conjunctiva/Sclera Melanosis Melanosis   Cornea trace tear film debris arcus, trace tear film debris   Anterior Chamber deep and clear deep and clear   Iris Round and dilated, No NVI Round and dilated, No NVI   Lens 2+ Nuclear sclerosis, 2+ Cortical cataract, 1+ Posterior subcapsular cataract 2+ Cortical cataract, +Vacuoles, 2+ Nuclear sclerosis   Anterior Vitreous syneresis syneresis         Fundus Exam       Right Left   Disc Pink and Sharp, mild PPP Pink and Sharp, +cupping   C/D Ratio 0.6 0.65   Macula Blunted foveal reflex, scattered MA/DBH, edema and exudates greatest superior and nasal mac-- worse Blunted foveal reflex, scattered MA/DBH, edema and punctate exudates greatest superior mac-- worse   Vessels attenuated, Tortuous, mild AV crossing changes, no NV attenuated, Tortuous, mild copper wiring, mild AV crossing changes, no NV   Periphery Attached, scattered MA / DBH greatest nasal to disc Attached, rare MA / DBH greatest nasal to disc, focal CWS inferior to disc           Refraction     Wearing Rx       Sphere Cylinder Axis Add   Right -2.50 +0.50 171 +2.25   Left -3.00 +1.00 013  +2.25  IMAGING AND PROCEDURES  Imaging and Procedures for 05/04/2024  OCT, Retina - OU - Both Eyes       Right Eye Quality was good. Central Foveal Thickness: 583. Progression has worsened. Findings include intraretinal hyper-reflective material, intraretinal fluid, vitreomacular adhesion (Interval increase in scattered IRF/IRHM greatest SN mac-- now extending through fovea with + SRF).   Left Eye Quality was good. Central Foveal Thickness: 458. Progression has improved. Findings include abnormal foveal contour, intraretinal hyper-reflective material, intraretinal fluid, vitreomacular adhesion (Interval improvement in scattered IRF/IRHM greatest superior mac with extension through fovea and + SRF).   Notes *Images captured and stored on drive  Diagnosis / Impression:  +DME OU OD: Interval increase in scattered IRF/IRHM greatest SN mac-- now extending through fovea with + SRF OS: Interval improvement in scattered IRF/IRHM greatest superior mac with extension through fovea and + SRF  Clinical management:  See below  Abbreviations: NFP - Normal foveal profile. CME - cystoid macular edema. PED - pigment epithelial detachment. IRF - intraretinal fluid. SRF - subretinal fluid. EZ - ellipsoid zone. ERM - epiretinal membrane. ORA - outer retinal atrophy. ORT - outer retinal tubulation. SRHM - subretinal hyper-reflective material. IRHM - intraretinal hyper-reflective material      Intravitreal Injection, Pharmacologic Agent - OD - Right Eye       Time Out 05/04/2024. 9:51 AM. Confirmed correct patient, procedure, site, and patient consented.   Anesthesia Topical anesthesia was used. Anesthetic medications included Lidocaine  2%, Proparacaine 0.5%.   Procedure Preparation included 5% betadine to ocular surface, eyelid speculum. A supplied needle was used.   Injection: 1.25 mg Bevacizumab  1.25mg /0.63ml   Route: Intravitreal, Site: Right Eye   NDC: 49757-939-98, Lot: [id number],  Expiration date: 05/14/2024   Post-op Post injection exam found visual acuity of at least counting fingers. The patient tolerated the procedure well. There were no complications. The patient received written and verbal post procedure care education.           ASSESSMENT/PLAN:   ICD-10-CM   1. Moderate nonproliferative diabetic retinopathy of both eyes with macular edema associated with type 2 diabetes mellitus (HCC)  E11.3313 OCT, Retina - OU - Both Eyes    Intravitreal Injection, Pharmacologic Agent - OD - Right Eye    Bevacizumab  (AVASTIN ) SOLN 1.25 mg    2. Long term (current) use of oral hypoglycemic drugs  Z79.84     3. Long-term (current) use of injectable non-insulin  antidiabetic drugs  Z79.85     4. Essential hypertension  I10     5. Hypertensive retinopathy of both eyes  H35.033     6. Combined forms of age-related cataract of both eyes  H25.813      1-3. Moderate Non-proliferative diabetic retinopathy, both eyes  - s/p IVA OS #1 (10.07.24), #2 (09.08.25)  - last A1c was 10.8 in September 2022 - exam shows scattered MA/DBH and exudates OU - FA (10.07.24) shows OD: Scattered leaking MA mostly posterior, No NV; OS: Focal clusters of leaking MA posteriorly greatest superior macula, no NV - OCT shows OD: Interval increase in scattered IRF/IRHM greatest SN mac-- now extending through fovea with + SRF; OS: Interval improvement in scattered IRF/IRHM greatest superior mac with extension through fovea and + SRF - BCVA OD 20/60; OS 20/40 - recommend IVA OD #1 today, 09.11.25 for DME - pt wishes to proceed with injection - RBA of procedure discussed, questions answered - IVA informed consent obtained and signed, 10.07.24 - see procedure note - f/u in 4 wks --  DFE/OCT, possible injection(s)  4,5. Hypertensive retinopathy OU - discussed importance of tight BP control - monitor  6. Mixed Cataract OU - The symptoms of cataract, surgical options, and treatments and risks were  discussed with patient. - discussed diagnosis and progression - monitor  Ophthalmic Meds Ordered this visit:  Meds ordered this encounter  Medications   Bevacizumab  (AVASTIN ) SOLN 1.25 mg     Return in about 4 weeks (around 06/01/2024) for NPDR OU, DFE, OCT, Possible Injxn.  There are no Patient Instructions on file for this visit.  Explained the diagnoses, plan, and follow up with the patient and they expressed understanding.  Patient expressed understanding of the importance of proper follow up care.   This document serves as a record of services personally performed by Redell JUDITHANN Hans, MD, PhD. It was created on their behalf by Auston Muzzy, COMT. The creation of this record is the provider's dictation and/or activities during the visit.  Electronically signed by: Auston Muzzy, COMT 05/04/24 2:01 PM  This document serves as a record of services personally performed by Redell JUDITHANN Hans, MD, PhD. It was created on their behalf by Almetta Pesa, an ophthalmic technician. The creation of this record is the provider's dictation and/or activities during the visit.    Electronically signed by: Almetta Pesa, OA, 05/04/24  2:01 PM  Redell JUDITHANN Hans, M.D., Ph.D. Diseases & Surgery of the Retina and Vitreous [facility]  I have reviewed the above documentation for accuracy and completeness, and I agree with the above. Redell JUDITHANN Hans, M.D., Ph.D. 05/04/24 2:04 PM   Abbreviations: M myopia (nearsighted); A astigmatism; H hyperopia (farsighted); P presbyopia; Mrx spectacle prescription;  CTL contact lenses; OD right eye; OS left eye; OU both eyes  XT exotropia; ET esotropia; PEK punctate epithelial keratitis; PEE punctate epithelial erosions; DES dry eye syndrome; MGD meibomian gland dysfunction; ATs artificial tears; PFAT's preservative free artificial tears; NSC nuclear sclerotic cataract; PSC posterior subcapsular cataract; ERM epi-retinal membrane; PVD posterior  vitreous detachment; RD retinal detachment; DM diabetes mellitus; DR diabetic retinopathy; NPDR non-proliferative diabetic retinopathy; PDR proliferative diabetic retinopathy; CSME clinically significant macular edema; DME diabetic macular edema; dbh dot blot hemorrhages; CWS cotton wool spot; POAG primary open angle glaucoma; C/D cup-to-disc ratio; HVF humphrey visual field; GVF goldmann visual field; OCT optical coherence tomography; IOP intraocular pressure; BRVO Branch retinal vein occlusion; CRVO central retinal vein occlusion; CRAO central retinal artery occlusion; BRAO branch retinal artery occlusion; RT retinal tear; SB scleral buckle; PPV pars plana vitrectomy; VH Vitreous hemorrhage; PRP panretinal laser photocoagulation; IVK intravitreal kenalog; VMT vitreomacular traction; MH Macular hole;  NVD neovascularization of the disc; NVE neovascularization elsewhere; AREDS age related eye disease study; ARMD age related macular degeneration; POAG primary open angle glaucoma; EBMD epithelial/anterior basement membrane dystrophy; ACIOL anterior chamber intraocular lens; IOL intraocular lens; PCIOL posterior chamber intraocular lens; Phaco/IOL phacoemulsification with intraocular lens placement; PRK photorefractive keratectomy; LASIK laser assisted in situ keratomileusis; HTN hypertension; DM diabetes mellitus; COPD chronic obstructive pulmonary disease

## 2024-05-04 ENCOUNTER — Encounter (INDEPENDENT_AMBULATORY_CARE_PROVIDER_SITE_OTHER): Payer: Self-pay | Admitting: Ophthalmology

## 2024-05-04 ENCOUNTER — Ambulatory Visit (INDEPENDENT_AMBULATORY_CARE_PROVIDER_SITE_OTHER): Admitting: Ophthalmology

## 2024-05-04 DIAGNOSIS — E113313 Type 2 diabetes mellitus with moderate nonproliferative diabetic retinopathy with macular edema, bilateral: Secondary | ICD-10-CM

## 2024-05-04 DIAGNOSIS — H35033 Hypertensive retinopathy, bilateral: Secondary | ICD-10-CM

## 2024-05-04 DIAGNOSIS — H25813 Combined forms of age-related cataract, bilateral: Secondary | ICD-10-CM

## 2024-05-04 DIAGNOSIS — Z7985 Long-term (current) use of injectable non-insulin antidiabetic drugs: Secondary | ICD-10-CM | POA: Diagnosis not present

## 2024-05-04 DIAGNOSIS — I10 Essential (primary) hypertension: Secondary | ICD-10-CM

## 2024-05-04 DIAGNOSIS — Z7984 Long term (current) use of oral hypoglycemic drugs: Secondary | ICD-10-CM

## 2024-05-04 MED ORDER — BEVACIZUMAB CHEMO INJECTION 1.25MG/0.05ML SYRINGE FOR KALEIDOSCOPE
1.2500 mg | INTRAVITREAL | Status: AC | PRN
  Administered 2024-05-04: 1.25 mg via INTRAVITREAL

## 2024-05-12 ENCOUNTER — Encounter: Payer: Self-pay | Admitting: Internal Medicine

## 2024-05-12 ENCOUNTER — Telehealth: Payer: Self-pay

## 2024-05-12 ENCOUNTER — Other Ambulatory Visit (HOSPITAL_COMMUNITY): Payer: Self-pay

## 2024-05-12 NOTE — Telephone Encounter (Signed)
 Pharmacy Patient Advocate Encounter   Received notification from Pt Calls Messages that prior authorization for Mounjaro  5MG /0.5ML auto-injectors is required/requested.   Insurance verification completed.   The patient is insured through [facility] .   Per test claim: PA required; PA submitted to above mentioned insurance via Latent Key/confirmation #/EOC [id number] Status is pending

## 2024-05-12 NOTE — Telephone Encounter (Signed)
 Pharmacy Patient Advocate Encounter  Received notification from [employer] that Prior Authorization for Mounjaro  5MG /0.5ML auto-injectors has been DENIED.  See denial reason below. No denial letter attached in CMM. Will attach denial letter to Media tab once received.    PA #/Case ID/Reference #: [id number]

## 2024-05-12 NOTE — Telephone Encounter (Signed)
 Mounjaro needs PA

## 2024-05-15 DIAGNOSIS — H2513 Age-related nuclear cataract, bilateral: Secondary | ICD-10-CM | POA: Diagnosis not present

## 2024-05-15 DIAGNOSIS — H40003 Preglaucoma, unspecified, bilateral: Secondary | ICD-10-CM | POA: Diagnosis not present

## 2024-05-15 DIAGNOSIS — H269 Unspecified cataract: Secondary | ICD-10-CM | POA: Diagnosis not present

## 2024-05-15 DIAGNOSIS — E113413 Type 2 diabetes mellitus with severe nonproliferative diabetic retinopathy with macular edema, bilateral: Secondary | ICD-10-CM | POA: Diagnosis not present

## 2024-05-15 MED ORDER — TRULICITY 1.5 MG/0.5ML ~~LOC~~ SOAJ: 1.5000 mg | SUBCUTANEOUS | 3 refills | Status: DC

## 2024-05-15 NOTE — Addendum Note (Signed)
 Addended by: SAM DONELL PARAS on: 05/15/2024 09:25 AM   Modules accepted: Orders

## 2024-05-24 ENCOUNTER — Other Ambulatory Visit: Payer: Self-pay | Admitting: Gastroenterology

## 2024-05-30 NOTE — Progress Notes (Signed)
 [facility] - Clinic Note  06/01/2024   CHIEF COMPLAINT Patient presents for Retina Follow Up  HISTORY OF PRESENT ILLNESS: Amy [name] is a 58 y.o. female who presents to the clinic today for:  HPI     Retina Follow Up   Patient presents with  Diabetic Retinopathy.  In both eyes.  This started 4 weeks ago.  I, the attending physician,  performed the HPI with the patient and updated documentation appropriately.        Comments   Patient here for 4 weeks retina follow up for NPDR OU. Patient states vision doing good. No eye pain. Taken off mounjaro  and replaced with trulicity  not received yet.      Last edited by [name], MD on 06/01/2024  4:05 PM.    Pt states she went to see the regular eye doc and is going to wait 3 months at least. [name] to [facility] center. Taken off Monjauro and put on Trulicity     Referring physician: [name], MD [address] [place],  [place] [address]  HISTORICAL INFORMATION:  Selected notes from the MEDICAL RECORD NUMBER Referred by Dr. [name] for concern of DME LEE:  Ocular Hx- PMH-   CURRENT MEDICATIONS: No current outpatient medications on file. (Ophthalmic Drugs)   No current facility-administered medications for this visit. (Ophthalmic Drugs)   Current Outpatient Medications (Other)  Medication Sig   aspirin EC 81 MG tablet Take 81 mg by mouth every other day. Swallow whole.   atorvastatin  (LIPITOR) 40 MG tablet TAKE 1 TABLET BY MOUTH  DAILY   Cholecalciferol (VITAMIN D3) 25 MCG (1000 UT) CAPS Take 1,000 Units by mouth daily.   Continuous Glucose Sensor (FREESTYLE LIBRE 3 PLUS SENSOR) MISC Change sensor every 15 days.   insulin  lispro (HUMALOG  KWIKPEN) 200 UNIT/ML KwikPen Inject 16-26 Units into the skin 3 (three) times daily.   Insulin  Pen Needle 31G X 5 MM MISC 1 Device by Does not apply route in the morning, at noon, in the evening, and at bedtime.   meloxicam  (MOBIC ) 7.5  MG tablet Take 1 tablet (7.5 mg total) by mouth daily.   Dulaglutide  (TRULICITY ) 1.5 MG/0.5ML SOAJ Inject 1.5 mg into the skin once a week.   ferrous sulfate 325 (65 FE) MG tablet Take 325 mg by mouth daily with breakfast.   insulin  glargine, 1 Unit Dial , (TOUJEO  SOLOSTAR) 300 UNIT/ML Solostar Pen Inject 40 Units into the skin daily in the afternoon.   irbesartan -hydrochlorothiazide  (AVALIDE) 300-12.5 MG tablet TAKE 1 TABLET BY MOUTH  DAILY   No current facility-administered medications for this visit. (Other)   REVIEW OF SYSTEMS: ROS   Positive for: Endocrine, Eyes, Respiratory Last edited by [name], COA on 06/01/2024  9:27 AM.      ALLERGIES Allergies  Allergen Reactions   Lisinopril Swelling   PAST MEDICAL HISTORY Past Medical History:  Diagnosis Date   Anemia    Diabetes mellitus without complication (HCC)    HLD (hyperlipidemia)    Hypertension    OSA (obstructive sleep apnea)    APAP S10 4-20 obt 2021   Sleep apnea    Past Surgical History:  Procedure Laterality Date   APPENDECTOMY  2003   CESAREAN SECTION  1995   COLONOSCOPY WITH PROPOFOL  N/A 12/13/2017   Procedure: COLONOSCOPY WITH PROPOFOL ;  Surgeon: [name], MD;  Location: [facility];  Service: Gastroenterology;  Laterality: N/A;   HYSTEROSCOPY WITH D & C N/A  05/19/2021   Procedure: DILATATION AND CURETTAGE, HYSTEROSCOPY WITH MYOSURE;  Surgeon: [name], MD;  Location: [facility];  Service: Gynecology;  Laterality: N/A;   REDUCTION MAMMAPLASTY Bilateral 2010   FAMILY HISTORY Family History  Problem Relation Age of Onset   Aneurysm Father    Ovarian cancer Maternal Aunt    Colon cancer Cousin    Colon cancer Cousin    Diabetes Brother    Prostate cancer Neg Hx    Pancreatic cancer Neg Hx    Cancer - Colon Neg Hx    Endometrial cancer Neg Hx    SOCIAL HISTORY Social History   Tobacco Use   Smoking status: Never   Smokeless tobacco: Never  Vaping Use   Vaping status:  Never Used  Substance Use Topics   Alcohol use: Not Currently    Comment: rare, 2 a year   Drug use: Never       OPHTHALMIC EXAM:  Base Eye Exam     Visual Acuity (Snellen - Linear)       Right Left   Dist cc 20/40 -1 20/40 -2   Dist ph cc 20/40 +2 NI    Correction: Glasses         Tonometry (Tonopen, 9:25 AM)       Right Left   Pressure 22 21         Pupils       Dark Light Shape React APD   Right 3 2 Round Minimal None   Left 3 2 Round Minimal None         Visual Fields (Counting fingers)       Left Right    Full Full         Extraocular Movement       Right Left    Full, Ortho Full, Ortho         Neuro/Psych     Oriented x3: Yes   Mood/Affect: Normal         Dilation     Both eyes: 1.0% Mydriacyl, 2.5% Phenylephrine @ 9:25 AM           Slit Lamp and Fundus Exam     Slit Lamp Exam       Right Left   Lids/Lashes Dermatochalasis - upper lid Dermatochalasis - upper lid   Conjunctiva/Sclera Melanosis Melanosis   Cornea trace tear film debris arcus, trace tear film debris   Anterior Chamber deep and clear deep and clear   Iris Round and dilated, No NVI Round and dilated, No NVI   Lens 2+ Nuclear sclerosis, 2+ Cortical cataract, 1+ Posterior subcapsular cataract 2+ Cortical cataract, +Vacuoles, 2+ Nuclear sclerosis   Anterior Vitreous syneresis syneresis         Fundus Exam       Right Left   Disc Pink and Sharp, mild PPP Pink and Sharp, +cupping   C/D Ratio 0.6 0.65   Macula Blunted foveal reflex, scattered MA/DBH, edema and exudates greatest superior and nasal mac-- improving Blunted foveal reflex, scattered MA/DBH, edema and punctate exudates greatest superior mac   Vessels attenuated, Tortuous, mild AV crossing changes, no NV attenuated, Tortuous, mild copper wiring, mild AV crossing changes, no NV   Periphery Attached, scattered MA / DBH greatest nasal to disc Attached, rare MA / DBH greatest nasal to disc, focal CWS  inferior to disc           Refraction     Wearing Rx  Sphere Cylinder Axis Add   Right -2.50 +0.50 171 +2.25   Left -3.00 +1.00 013 +2.25           IMAGING AND PROCEDURES  Imaging and Procedures for 06/01/2024  OCT, Retina - OU - Both Eyes       Right Eye Quality was good. Central Foveal Thickness: 294. Progression has improved. Findings include no SRF, intraretinal hyper-reflective material, intraretinal fluid, vitreomacular adhesion (Significant improvement in central edema and foveal contour).   Left Eye Quality was good. Central Foveal Thickness: 571. Progression has worsened. Findings include abnormal foveal contour, intraretinal hyper-reflective material, intraretinal fluid, vitreomacular adhesion (Interval increase in IRF/IRHM greatest superior mac with extension through fovea and + SRF; SRF slightly improved).   Notes *Images captured and stored on drive  Diagnosis / Impression:  +DME OU OD: Significant improvement in central edema and foveal contour OS: Interval increase in IRF/IRHM greatest superior mac with extension through fovea and + SRF; SRF slightly improved  Clinical management:  See below  Abbreviations: NFP - Normal foveal profile. CME - cystoid macular edema. PED - pigment epithelial detachment. IRF - intraretinal fluid. SRF - subretinal fluid. EZ - ellipsoid zone. ERM - epiretinal membrane. ORA - outer retinal atrophy. ORT - outer retinal tubulation. SRHM - subretinal hyper-reflective material. IRHM - intraretinal hyper-reflective material      Intravitreal Injection, Pharmacologic Agent - OD - Right Eye       Time Out 06/01/2024. 10:23 AM. Confirmed correct patient, procedure, site, and patient consented.   Anesthesia Topical anesthesia was used. Anesthetic medications included Lidocaine  2%, Proparacaine 0.5%.   Procedure Preparation included 5% betadine to ocular surface, eyelid speculum. A supplied needle was used.    Injection: 1.25 mg Bevacizumab  1.25mg /0.29ml   Route: Intravitreal, Site: Right Eye   NDC: H525437, Lot: [id number], Expiration date: 06/11/2024   Post-op Post injection exam found visual acuity of at least counting fingers. The patient tolerated the procedure well. There were no complications. The patient received written and verbal post procedure care education.      Intravitreal Injection, Pharmacologic Agent - OS - Left Eye       Time Out 06/01/2024. 10:26 AM. Confirmed correct patient, procedure, site, and patient consented.   Anesthesia Topical anesthesia was used. Anesthetic medications included Lidocaine  2%, Proparacaine 0.5%.   Procedure Preparation included 5% betadine to ocular surface, eyelid speculum. A (32g) needle was used.   Injection: 1.25 mg Bevacizumab  1.25mg /0.59ml   Route: Intravitreal, Site: Left Eye   NDC: H525437, Lot: [id number], Expiration date: 09/02/2024   Post-op Post injection exam found visual acuity of at least counting fingers. The patient tolerated the procedure well. There were no complications. The patient received written and verbal post procedure care education.            ASSESSMENT/PLAN:   ICD-10-CM   1. Moderate nonproliferative diabetic retinopathy of both eyes with macular edema associated with type 2 diabetes mellitus (HCC)  E11.3313 OCT, Retina - OU - Both Eyes    Intravitreal Injection, Pharmacologic Agent - OD - Right Eye    Intravitreal Injection, Pharmacologic Agent - OS - Left Eye    Bevacizumab  (AVASTIN ) SOLN 1.25 mg    Bevacizumab  (AVASTIN ) SOLN 1.25 mg    2. Long term (current) use of oral hypoglycemic drugs  Z79.84     3. Long-term (current) use of injectable non-insulin  antidiabetic drugs  Z79.85     4. Essential hypertension  I10     5. Hypertensive retinopathy of  both eyes  H35.033     6. Combined forms of age-related cataract of both eyes  H25.813      1-3. Moderate Non-proliferative diabetic  retinopathy, both eyes  - s/p IVA OD #1 (09.11.25)  - s/p IVA OS #1 (10.07.24), #2 (09.08.25)  - last A1c was 8.5 (08.04.25), 10.8 in September 2022 - exam shows scattered MA/DBH and exudates OU - FA (10.07.24) shows OD: Scattered leaking MA mostly posterior, No NV; OS: Focal clusters of leaking MA posteriorly greatest superior macula, no NV - OCT shows NI:Dphwpqprjwu improvement in central edema and foveal contour; OS: Interval increase in IRF/IRHM greatest superior mac with extension through fovea and + SRF; SRF slightly improved - BCVA OD 20/40 from 20/60; OS 20/40 - stable - recommend IVA OU (OD #2, OS #3) today, 10.09.25 for DME - pt wishes to proceed with injection - RBA of procedure discussed, questions answered - IVA informed consent obtained and signed, 10.07.24 - see procedure note - f/u in 4 wks -- DFE/OCT, possible injection(s)  4,5. Hypertensive retinopathy OU - discussed importance of tight BP control - monitor  6. Mixed Cataract OU - The symptoms of cataract, surgical options, and treatments and risks were discussed with patient. - discussed diagnosis and progression - monitor  Ophthalmic Meds Ordered this visit:  Meds ordered this encounter  Medications   Bevacizumab  (AVASTIN ) SOLN 1.25 mg   Bevacizumab  (AVASTIN ) SOLN 1.25 mg     Return in about 4 weeks (around 06/29/2024) for NPDR OU, DFE, OCT, Possible Injxn.  There are no Patient Instructions on file for this visit.  Explained the diagnoses, plan, and follow up with the patient and they expressed understanding.  Patient expressed understanding of the importance of proper follow up care.   This document serves as a record of services personally performed by Redell JUDITHANN Hans, MD, PhD. It was created on their behalf by Auston Muzzy, COMT. The creation of this record is the provider's dictation and/or activities during the visit.  Electronically signed by: Auston Muzzy, COMT 06/01/24 4:07 PM  This document serves  as a record of services personally performed by Redell JUDITHANN Hans, MD, PhD. It was created on their behalf by Almetta Pesa, an ophthalmic technician. The creation of this record is the provider's dictation and/or activities during the visit.    Electronically signed by: Almetta Pesa, OA, 06/01/24  4:07 PM  Redell JUDITHANN Hans, M.D., Ph.D. Diseases & Surgery of the Retina and Vitreous [facility]  I have reviewed the above documentation for accuracy and completeness, and I agree with the above. Redell JUDITHANN Hans, M.D., Ph.D. 06/01/24 4:08 PM   Abbreviations: M myopia (nearsighted); A astigmatism; H hyperopia (farsighted); P presbyopia; Mrx spectacle prescription;  CTL contact lenses; OD right eye; OS left eye; OU both eyes  XT exotropia; ET esotropia; PEK punctate epithelial keratitis; PEE punctate epithelial erosions; DES dry eye syndrome; MGD meibomian gland dysfunction; ATs artificial tears; PFAT's preservative free artificial tears; NSC nuclear sclerotic cataract; PSC posterior subcapsular cataract; ERM epi-retinal membrane; PVD posterior vitreous detachment; RD retinal detachment; DM diabetes mellitus; DR diabetic retinopathy; NPDR non-proliferative diabetic retinopathy; PDR proliferative diabetic retinopathy; CSME clinically significant macular edema; DME diabetic macular edema; dbh dot blot hemorrhages; CWS cotton wool spot; POAG primary open angle glaucoma; C/D cup-to-disc ratio; HVF humphrey visual field; GVF goldmann visual field; OCT optical coherence tomography; IOP intraocular pressure; BRVO Branch retinal vein occlusion; CRVO central retinal vein occlusion; CRAO central retinal artery occlusion;  BRAO branch retinal artery occlusion; RT retinal tear; SB scleral buckle; PPV pars plana vitrectomy; VH Vitreous hemorrhage; PRP panretinal laser photocoagulation; IVK intravitreal kenalog; VMT vitreomacular traction; MH Macular hole;  NVD neovascularization of the disc; NVE  neovascularization elsewhere; AREDS age related eye disease study; ARMD age related macular degeneration; POAG primary open angle glaucoma; EBMD epithelial/anterior basement membrane dystrophy; ACIOL anterior chamber intraocular lens; IOL intraocular lens; PCIOL posterior chamber intraocular lens; Phaco/IOL phacoemulsification with intraocular lens placement; PRK photorefractive keratectomy; LASIK laser assisted in situ keratomileusis; HTN hypertension; DM diabetes mellitus; COPD chronic obstructive pulmonary disease

## 2024-06-01 ENCOUNTER — Telehealth: Payer: Self-pay

## 2024-06-01 ENCOUNTER — Other Ambulatory Visit (HOSPITAL_COMMUNITY): Payer: Self-pay

## 2024-06-01 ENCOUNTER — Ambulatory Visit (INDEPENDENT_AMBULATORY_CARE_PROVIDER_SITE_OTHER): Admitting: Ophthalmology

## 2024-06-01 ENCOUNTER — Encounter (INDEPENDENT_AMBULATORY_CARE_PROVIDER_SITE_OTHER): Payer: Self-pay | Admitting: Ophthalmology

## 2024-06-01 DIAGNOSIS — Z7984 Long term (current) use of oral hypoglycemic drugs: Secondary | ICD-10-CM | POA: Diagnosis not present

## 2024-06-01 DIAGNOSIS — Z7985 Long-term (current) use of injectable non-insulin antidiabetic drugs: Secondary | ICD-10-CM | POA: Diagnosis not present

## 2024-06-01 DIAGNOSIS — E113313 Type 2 diabetes mellitus with moderate nonproliferative diabetic retinopathy with macular edema, bilateral: Secondary | ICD-10-CM

## 2024-06-01 DIAGNOSIS — I10 Essential (primary) hypertension: Secondary | ICD-10-CM

## 2024-06-01 DIAGNOSIS — H25813 Combined forms of age-related cataract, bilateral: Secondary | ICD-10-CM

## 2024-06-01 DIAGNOSIS — H35033 Hypertensive retinopathy, bilateral: Secondary | ICD-10-CM | POA: Diagnosis not present

## 2024-06-01 MED ORDER — BEVACIZUMAB CHEMO INJECTION 1.25MG/0.05ML SYRINGE FOR KALEIDOSCOPE
1.2500 mg | INTRAVITREAL | Status: AC | PRN
  Administered 2024-06-01: 1.25 mg via INTRAVITREAL

## 2024-06-01 NOTE — Telephone Encounter (Signed)
 Trulicity needs a PA

## 2024-06-01 NOTE — Telephone Encounter (Signed)
 Pharmacy Patient Advocate Encounter   Received notification from Pt Calls Messages that prior authorization for Trulicity  1.5MG /0.5ML auto-injectors is required/requested.   Insurance verification completed.   The patient is insured through [facility].   Per test claim: PA required; PA submitted to above mentioned insurance via Latent Key/confirmation #/EOC [id number] Status is pending

## 2024-06-06 ENCOUNTER — Encounter (HOSPITAL_COMMUNITY): Payer: Self-pay | Admitting: Gastroenterology

## 2024-06-13 ENCOUNTER — Ambulatory Visit (HOSPITAL_COMMUNITY): Admitting: Anesthesiology

## 2024-06-13 ENCOUNTER — Ambulatory Visit (HOSPITAL_COMMUNITY)
Admission: RE | Admit: 2024-06-13 | Discharge: 2024-06-13 | Disposition: A | Discharge status: Home / Self Care | Attending: Gastroenterology | Admitting: Gastroenterology

## 2024-06-13 ENCOUNTER — Encounter (HOSPITAL_COMMUNITY)
Admission: RE | Disposition: A | Discharge status: Home / Self Care | Payer: Self-pay | Source: Home / Self Care | Attending: Gastroenterology

## 2024-06-13 ENCOUNTER — Other Ambulatory Visit: Payer: Self-pay

## 2024-06-13 ENCOUNTER — Encounter (HOSPITAL_COMMUNITY): Payer: Self-pay | Admitting: Gastroenterology

## 2024-06-13 DIAGNOSIS — Z1211 Encounter for screening for malignant neoplasm of colon: Secondary | ICD-10-CM

## 2024-06-13 DIAGNOSIS — I10 Essential (primary) hypertension: Secondary | ICD-10-CM

## 2024-06-13 DIAGNOSIS — K6389 Other specified diseases of intestine: Secondary | ICD-10-CM | POA: Diagnosis not present

## 2024-06-13 DIAGNOSIS — E119 Type 2 diabetes mellitus without complications: Secondary | ICD-10-CM | POA: Diagnosis not present

## 2024-06-13 DIAGNOSIS — K648 Other hemorrhoids: Secondary | ICD-10-CM | POA: Diagnosis not present

## 2024-06-13 DIAGNOSIS — Z09 Encounter for follow-up examination after completed treatment for conditions other than malignant neoplasm: Secondary | ICD-10-CM | POA: Diagnosis not present

## 2024-06-13 DIAGNOSIS — Z860101 Personal history of adenomatous and serrated colon polyps: Secondary | ICD-10-CM | POA: Diagnosis not present

## 2024-06-13 DIAGNOSIS — Z6841 Body Mass Index (BMI) 40.0 and over, adult: Secondary | ICD-10-CM | POA: Diagnosis not present

## 2024-06-13 DIAGNOSIS — G4733 Obstructive sleep apnea (adult) (pediatric): Secondary | ICD-10-CM | POA: Diagnosis not present

## 2024-06-13 DIAGNOSIS — K635 Polyp of colon: Secondary | ICD-10-CM | POA: Insufficient documentation

## 2024-06-13 DIAGNOSIS — E6689 Other obesity not elsewhere classified: Secondary | ICD-10-CM | POA: Insufficient documentation

## 2024-06-13 DIAGNOSIS — E1165 Type 2 diabetes mellitus with hyperglycemia: Secondary | ICD-10-CM | POA: Diagnosis not present

## 2024-06-13 DIAGNOSIS — Z860100 Personal history of colon polyps, unspecified: Secondary | ICD-10-CM | POA: Diagnosis not present

## 2024-06-13 DIAGNOSIS — D123 Benign neoplasm of transverse colon: Secondary | ICD-10-CM | POA: Diagnosis not present

## 2024-06-13 HISTORY — PX: COLONOSCOPY: SHX5424

## 2024-06-13 LAB — GLUCOSE, CAPILLARY: Glucose-Capillary: 149 mg/dL — ABNORMAL HIGH (ref 70–99)

## 2024-06-13 SURGERY — COLONOSCOPY
Anesthesia: Monitor Anesthesia Care

## 2024-06-13 MED ORDER — PROPOFOL 500 MG/50ML IV EMUL
INTRAVENOUS | Status: AC
Start: 2024-06-13 — End: 2024-06-13
  Filled 2024-06-13: qty 50

## 2024-06-13 MED ORDER — PROPOFOL 10 MG/ML IV BOLUS
INTRAVENOUS | Status: DC | PRN
  Administered 2024-06-13: 20 mg via INTRAVENOUS
  Administered 2024-06-13: 10 mg via INTRAVENOUS
  Administered 2024-06-13: 20 mg via INTRAVENOUS
  Administered 2024-06-13: 10 mg via INTRAVENOUS

## 2024-06-13 MED ORDER — SODIUM CHLORIDE 0.9 % IV SOLN
INTRAVENOUS | Status: DC
Start: 2024-06-13 — End: 2024-06-13

## 2024-06-13 MED ORDER — SODIUM CHLORIDE 0.9 % IV SOLN
INTRAVENOUS | Status: AC | PRN
  Administered 2024-06-13: 500 mL via INTRAMUSCULAR

## 2024-06-13 MED ORDER — PROPOFOL 500 MG/50ML IV EMUL
INTRAVENOUS | Status: DC | PRN
  Administered 2024-06-13: 130 ug/kg/min via INTRAVENOUS

## 2024-06-13 MED ORDER — PROPOFOL 1000 MG/100ML IV EMUL
INTRAVENOUS | Status: AC
  Filled 2024-06-13: qty 100

## 2024-06-13 NOTE — Anesthesia Preprocedure Evaluation (Addendum)
 Anesthesia Evaluation  Patient identified by MRN, date of birth, ID band Patient awake    Reviewed: Allergy & Precautions, H&P , NPO status , Patient's Chart, lab work & pertinent test results  Airway Mallampati: II  TM Distance: >3 FB Neck ROM: Full    Dental no notable dental hx.    Pulmonary sleep apnea    Pulmonary exam normal breath sounds clear to auscultation       Cardiovascular hypertension, Normal cardiovascular exam Rhythm:Regular Rate:Normal     Neuro/Psych negative neurological ROS  negative psych ROS   GI/Hepatic negative GI ROS, Neg liver ROS,,,  Endo/Other  diabetes, Type 2  Class 4 obesity  Renal/GU negative Renal ROS  negative genitourinary   Musculoskeletal negative musculoskeletal ROS (+)    Abdominal  (+) + obese  Peds negative pediatric ROS (+)  Hematology  (+) Blood dyscrasia, anemia   Anesthesia Other Findings   Reproductive/Obstetrics negative OB ROS                              Anesthesia Physical Anesthesia Plan  ASA: 4  Anesthesia Plan: MAC   Post-op Pain Management:    Induction: Intravenous  PONV Risk Score and Plan: 2 and Propofol  infusion and Treatment may vary due to age or medical condition  Airway Management Planned: Natural Airway  Additional Equipment:   Intra-op Plan:   Post-operative Plan:   Informed Consent: I have reviewed the patients History and Physical, chart, labs and discussed the procedure including the risks, benefits and alternatives for the proposed anesthesia with the patient or authorized representative who has indicated his/her understanding and acceptance.     Dental advisory given  Plan Discussed with: CRNA  Anesthesia Plan Comments:         Anesthesia Quick Evaluation

## 2024-06-13 NOTE — Op Note (Signed)
 [facility] Patient Name: Amy [name] Procedure Date: 06/13/2024 MRN: [id number] Attending MD: Layla Lah , MD, [id number] Date of Birth: 12-01-1965 CSN: [id number] Age: 58 Admit Type: Outpatient Procedure:                Colonoscopy Indications:              Surveillance: Personal history of adenomatous                            polyps on last colonoscopy > 5 years ago Providers:                Layla Lah, MD, Darleene Bare, RN, Haskel Chris, Technician Referring MD:              Medicines:                Sedation Administered by an Anesthesia Professional Complications:            No immediate complications. Estimated Blood Loss:     Estimated blood loss was minimal. Procedure:                Pre-Anesthesia Assessment:                           - Prior to the procedure, a History and Physical                            was performed, and patient medications and                            allergies were reviewed. The patient's tolerance of                            previous anesthesia was also reviewed. The risks                            and benefits of the procedure and the sedation                            options and risks were discussed with the patient.                            All questions were answered, and informed consent                            was obtained. Prior Anticoagulants: The patient has                            taken no anticoagulant or antiplatelet agents. ASA                            Grade Assessment: III - A patient with severe  systemic disease. After reviewing the risks and                            benefits, the patient was deemed in satisfactory                            condition to undergo the procedure.                           After obtaining informed consent, the colonoscope                            was passed under direct vision. Throughout the                             procedure, the patient's blood pressure, pulse, and                            oxygen saturations were monitored continuously. The                            [id number]-[id number] ([id number] colonoscope was introduced                            through the anus and advanced to the the cecum,                            identified by appendiceal orifice and ileocecal                            valve. The patient tolerated the procedure well.                            The quality of the bowel preparation was Adequate                            except for scattered areas of fair prep. The                            ileocecal valve, appendiceal orifice, and rectum                            were photographed. The colonoscopy was performed                            with moderate difficulty due to the patient's body                            habitus. Successful completion of the procedure was                            aided by applying abdominal pressure. Scope In: 8:58:40 AM Scope Out: 9:18:54 AM Scope Withdrawal Time: 0 hours 9 minutes 58 seconds  Total Procedure Duration:  0 hours 20 minutes 14 seconds  Findings:      The perianal and digital rectal examinations were normal.      Two polyps were found in the hepatic flexure. The polyps were diminutive       in size. These polyps were removed with a cold biopsy forceps. Resection       and retrieval were complete.      A 4 mm polyp was found in the descending colon. The polyp was sessile.       The polyp was removed with a cold snare. Resection and retrieval were       complete.      Internal hemorrhoids were found during retroflexion. The hemorrhoids       were small. Impression:               - Preparation of the colon was Adequate except for                            scattered areas of fair prep.                           - Two diminutive polyps at the hepatic flexure,                            removed with a cold  biopsy forceps. Resected and                            retrieved.                           - One 4 mm polyp in the descending colon, removed                            with a cold snare. Resected and retrieved.                           - Internal hemorrhoids. Moderate Sedation:      Moderate (conscious) sedation was personally administered by an       anesthesia professional. The following parameters were monitored: oxygen       saturation, heart rate, blood pressure, and response to care. Recommendation:           - Patient has a contact number available for                            emergencies. The signs and symptoms of potential                            delayed complications were discussed with the                            patient. Return to normal activities tomorrow.                            Written discharge instructions were provided to the  patient.                           - Resume previous diet.                           - Continue present medications.                           - Await pathology results.                           - Repeat colonoscopy date to be determined after                            pending pathology results are reviewed for                            surveillance.                           - Return to my office PRN. Procedure Code(s):        --- Professional ---                           [phone], Colonoscopy, flexible; with removal of                            tumor(s), polyp(s), or other lesion(s) by snare                            technique                           45380, 59, Colonoscopy, flexible; with biopsy,                            single or multiple Diagnosis Code(s):        --- Professional ---                           Z86.010, Personal history of colonic polyps                           K64.8, Other hemorrhoids                           D12.3, Benign neoplasm of transverse colon (hepatic                             flexure or splenic flexure)                           D12.4, Benign neoplasm of descending colon CPT copyright 2022 [employer]. All rights reserved. The codes documented in this report are preliminary and upon coder review may  be revised to meet current compliance requirements. Layla Lah, MD Layla Lah, MD 06/13/2024 9:38:26 AM Number of Addenda: 0

## 2024-06-13 NOTE — H&P (Signed)
 Primary Care Physician:  [name], MD Primary Gastroenterologist:  Dr. Elicia  Reason for Visit -outpatient surveillance colonoscopy  HPI: Amy [name] is a 58 y.o. female with past medical history of morbid obesity with BMI of more than 50, history of sleep apnea here for outpatient surveillance colonoscopy.  Last colonoscopy in 2019 showed small tubular adenoma and fair prep.  Patient denies any GI symptoms.  Past Medical History:  Diagnosis Date   Anemia    Diabetes mellitus without complication (HCC)    HLD (hyperlipidemia)    Hypertension    OSA (obstructive sleep apnea)    APAP S10 4-20 obt 2021   Sleep apnea     Past Surgical History:  Procedure Laterality Date   APPENDECTOMY  2003   CESAREAN SECTION  1995   COLONOSCOPY WITH PROPOFOL  N/A 12/13/2017   Procedure: COLONOSCOPY WITH PROPOFOL ;  Surgeon: [name], MD;  Location: [facility];  Service: Gastroenterology;  Laterality: N/A;   HYSTEROSCOPY WITH D & C N/A 05/19/2021   Procedure: DILATATION AND CURETTAGE, HYSTEROSCOPY WITH MYOSURE;  Surgeon: [name], MD;  Location: [facility];  Service: Gynecology;  Laterality: N/A;   REDUCTION MAMMAPLASTY Bilateral 2010    Prior to Admission medications   Medication Sig Start Date End Date Taking? Authorizing Provider  aspirin EC 81 MG tablet Take 81 mg by mouth every other day. Swallow whole.   Yes [provider]  atorvastatin  (LIPITOR) 40 MG tablet TAKE 1 TABLET BY MOUTH  DAILY 04/13/22  Yes Shamleffer, Ibtehal Jaralla, MD  Cholecalciferol (VITAMIN D3) 25 MCG (1000 UT) CAPS Take 1,000 Units by mouth daily.   Yes [provider]  ferrous sulfate 325 (65 FE) MG tablet Take 325 mg by mouth daily with breakfast.   Yes [provider]  insulin  glargine, 1 Unit Dial , (TOUJEO  SOLOSTAR) 300 UNIT/ML Solostar Pen Inject 40 Units into the skin daily in the afternoon. 03/27/24  Yes Shamleffer, Ibtehal Jaralla, MD  insulin  lispro  (HUMALOG  KWIKPEN) 200 UNIT/ML KwikPen Inject 16-26 Units into the skin 3 (three) times daily. 03/27/24  Yes Shamleffer, Ibtehal Jaralla, MD  irbesartan -hydrochlorothiazide  (AVALIDE) 300-12.5 MG tablet TAKE 1 TABLET BY MOUTH  DAILY 10/06/21  Yes Shamleffer, Ibtehal Jaralla, MD  metoprolol succinate (TOPROL-XL) 25 MG 24 hr tablet Take 25 mg by mouth daily.   Yes [provider]  Continuous Glucose Sensor (FREESTYLE LIBRE 3 PLUS SENSOR) MISC Change sensor every 15 days. 03/27/24   Shamleffer, Ibtehal Jaralla, MD  Dulaglutide  (TRULICITY ) 1.5 MG/0.5ML SOAJ Inject 1.5 mg into the skin once a week. Patient not taking: Reported on 06/06/2024 05/15/24   Shamleffer, Ibtehal Jaralla, MD  Insulin  Pen Needle 31G X 5 MM MISC 1 Device by Does not apply route in the morning, at noon, in the evening, and at bedtime. 03/27/24   Shamleffer, Ibtehal Jaralla, MD  meloxicam  (MOBIC ) 7.5 MG tablet Take 1 tablet (7.5 mg total) by mouth daily. 12/05/23   Desiderio Chew, PA-C    Scheduled Meds: Continuous Infusions:  sodium chloride      PRN Meds:.  Allergies as of 05/24/2024 - Review Complete 05/04/2024  Allergen Reaction Noted   Lisinopril Swelling 09/08/2012    Family History  Problem Relation Age of Onset   Aneurysm Father    Ovarian cancer Maternal Aunt    Colon cancer Cousin    Colon cancer Cousin    Diabetes Brother    Prostate cancer Neg Hx    Pancreatic cancer Neg Hx    Cancer -  Colon Neg Hx    Endometrial cancer Neg Hx     Social History   Socioeconomic History   Marital status: Single    Spouse name: Not on file   Number of children: Not on file   Years of education: Not on file   Highest education level: Not on file  Occupational History   Not on file  Tobacco Use   Smoking status: Never   Smokeless tobacco: Never  Vaping Use   Vaping status: Never Used  Substance and Sexual Activity   Alcohol use: Not Currently    Comment: rare, 2 a year   Drug use: Never   Sexual activity: Not  Currently    Birth control/protection: None  Other Topics Concern   Not on file  Social History Narrative   Not on file   Social Drivers of Health   Financial Resource Strain: Not on file  Food Insecurity: Not on file  Transportation Needs: Not on file  Physical Activity: Not on file  Stress: Not on file  Social Connections: Not on file  Intimate Partner Violence: Not on file    Review of Systems: All negative except as stated above in HPI.  Physical Exam: Vital signs: Vitals:   06/13/24 0752  Pulse: 75  Resp: 20  Temp: 97.8 F (36.6 C)  SpO2: 97%     General:   Morbidly obese, not in acute distress Lungs: No visible respiratory distress Heart:  Regular rate and rhythm; no murmurs, clicks, rubs,  or gallops. Abdomen: Soft, nontender, nondistended, bowel sound present, no peritoneal signs Mood and affect normal Alert and oriented x 3 Rectal:  Deferred  GI:  Lab Results: No results for input(s): WBC, HGB, HCT, PLT in the last 72 hours. BMET No results for input(s): NA, K, CL, CO2, GLUCOSE, BUN, CREATININE, CALCIUM  in the last 72 hours. LFT No results for input(s): PROT, ALBUMIN, AST, ALT, ALKPHOS, BILITOT, BILIDIR, IBILI in the last 72 hours. PT/INR No results for input(s): LABPROT, INR in the last 72 hours.   Studies/Results: No results found.  Impression/Plan: -Personal history of adenomatous polyp - Morbid obesity with BMI of more than 50  Recommendations -------------------------- - Proceed with colonoscopy today.  Risks (bleeding, infection, bowel perforation that could require surgery, sedation-related changes in cardiopulmonary systems), benefits (identification and possible treatment of source of symptoms, exclusion of certain causes of symptoms), and alternatives (watchful waiting, radiographic imaging studies, empiric medical treatment)  were explained to patient/family in detail and patient wishes to  proceed.     LOS: 0 days   Layla Lah  MD, FACP 06/13/2024, 8:01 AM  Contact #  [phone]

## 2024-06-13 NOTE — Anesthesia Postprocedure Evaluation (Signed)
 Anesthesia Post Note  Patient: Amy [name]  Procedure(s) Performed: COLONOSCOPY     Patient location during evaluation: PACU Anesthesia Type: MAC Level of consciousness: awake and alert Pain management: pain level controlled Vital Signs Assessment: post-procedure vital signs reviewed and stable Respiratory status: spontaneous breathing, nonlabored ventilation, respiratory function stable and patient connected to nasal cannula oxygen Cardiovascular status: stable and blood pressure returned to baseline Postop Assessment: no apparent nausea or vomiting Anesthetic complications: no   No notable events documented.  Last Vitals:  Vitals:   06/13/24 0950 06/13/24 0955  BP: (!) 136/52   Pulse: 71 69  Resp: (!) 25 (!) 23  Temp:    SpO2: 98% 100%    Last Pain:  Vitals:   06/13/24 0955  TempSrc:   PainSc: 0-No pain                 Thom JONELLE Peoples

## 2024-06-13 NOTE — Transfer of Care (Addendum)
 Immediate Anesthesia Transfer of Care Note  Patient: Amy [name]  Procedure(s) Performed: COLONOSCOPY  Patient Location: PACU  Anesthesia Type:MAC  Level of Consciousness: sedated  Airway & Oxygen Therapy: Patient Spontanous Breathing and Patient connected to face mask oxygen  Post-op Assessment: Report given to RN and Post -op Vital signs reviewed and stable  Post vital signs: Reviewed and stable  Last Vitals:  Vitals Value Taken Time  BP    Temp    Pulse    Resp    SpO2      Last Pain:  Vitals:   06/13/24 0752  TempSrc: Temporal  PainSc: 2          Complications: No notable events documented.

## 2024-06-13 NOTE — Discharge Instructions (Signed)

## 2024-06-14 ENCOUNTER — Encounter (HOSPITAL_COMMUNITY): Payer: Self-pay | Admitting: Gastroenterology

## 2024-06-15 LAB — SURGICAL PATHOLOGY

## 2024-06-23 NOTE — Telephone Encounter (Signed)
 Patient was inquiring about the prior authorization on her trulicity .Could someone take a look and check the status please.

## 2024-06-26 MED ORDER — TRULICITY 1.5 MG/0.5ML ~~LOC~~ SOAJ: 1.5000 mg | SUBCUTANEOUS | 3 refills | Status: AC

## 2024-06-27 ENCOUNTER — Encounter: Payer: Self-pay | Admitting: Internal Medicine

## 2024-06-27 ENCOUNTER — Ambulatory Visit: Admitting: Internal Medicine

## 2024-06-27 DIAGNOSIS — I10 Essential (primary) hypertension: Secondary | ICD-10-CM | POA: Diagnosis not present

## 2024-06-27 DIAGNOSIS — E1165 Type 2 diabetes mellitus with hyperglycemia: Secondary | ICD-10-CM | POA: Diagnosis not present

## 2024-06-27 DIAGNOSIS — M25569 Pain in unspecified knee: Secondary | ICD-10-CM | POA: Diagnosis not present

## 2024-07-03 NOTE — Progress Notes (Shared)
 [facility] - Clinic Note  07/05/2024   CHIEF COMPLAINT Patient presents for No chief complaint on file.  HISTORY OF PRESENT ILLNESS: Amy [name] is a 58 y.o. female who presents to the clinic today for:   Pt states she went to see the regular eye doc and is going to wait 3 months at least. [name] to [facility] center. Taken off Monjauro and put on Trulicity     Referring physician: [name], MD [address] [place],  [place] [address]  HISTORICAL INFORMATION:  Selected notes from the MEDICAL RECORD NUMBER Referred by Dr. [name] for concern of DME LEE:  Ocular Hx- PMH-   CURRENT MEDICATIONS: No current outpatient medications on file. (Ophthalmic Drugs)   No current facility-administered medications for this visit. (Ophthalmic Drugs)   Current Outpatient Medications (Other)  Medication Sig   aspirin EC 81 MG tablet Take 81 mg by mouth every other day. Swallow whole.   atorvastatin  (LIPITOR) 40 MG tablet TAKE 1 TABLET BY MOUTH  DAILY   Cholecalciferol (VITAMIN D3) 25 MCG (1000 UT) CAPS Take 1,000 Units by mouth daily.   Continuous Glucose Sensor (FREESTYLE LIBRE 3 PLUS SENSOR) MISC Change sensor every 15 days.   Dulaglutide  (TRULICITY ) 1.5 MG/0.5ML SOAJ Inject 1.5 mg into the skin once a week.   ferrous sulfate 325 (65 FE) MG tablet Take 325 mg by mouth daily with breakfast.   insulin  glargine, 1 Unit Dial , (TOUJEO  SOLOSTAR) 300 UNIT/ML Solostar Pen Inject 40 Units into the skin daily in the afternoon.   insulin  lispro (HUMALOG  KWIKPEN) 200 UNIT/ML KwikPen Inject 16-26 Units into the skin 3 (three) times daily.   Insulin  Pen Needle 31G X 5 MM MISC 1 Device by Does not apply route in the morning, at noon, in the evening, and at bedtime.   irbesartan -hydrochlorothiazide  (AVALIDE) 300-12.5 MG tablet TAKE 1 TABLET BY MOUTH  DAILY   meloxicam  (MOBIC ) 7.5 MG tablet Take 1 tablet (7.5 mg total) by mouth daily.   metoprolol  succinate (TOPROL-XL) 25 MG 24 hr tablet Take 25 mg by mouth daily.   No current facility-administered medications for this visit. (Other)   REVIEW OF SYSTEMS:    ALLERGIES Allergies  Allergen Reactions   Lisinopril Swelling   Amlodipine     Dizzy   PAST MEDICAL HISTORY Past Medical History:  Diagnosis Date   Anemia    Diabetes mellitus without complication (HCC)    HLD (hyperlipidemia)    Hypertension    OSA (obstructive sleep apnea)    APAP S10 4-20 obt 2021   Sleep apnea    Past Surgical History:  Procedure Laterality Date   APPENDECTOMY  2003   CESAREAN SECTION  1995   COLONOSCOPY N/A 06/13/2024   Procedure: COLONOSCOPY;  Surgeon: [name], MD;  Location: [facility];  Service: Gastroenterology;  Laterality: N/A;   COLONOSCOPY WITH PROPOFOL  N/A 12/13/2017   Procedure: COLONOSCOPY WITH PROPOFOL ;  Surgeon: [name], MD;  Location: [facility];  Service: Gastroenterology;  Laterality: N/A;   HYSTEROSCOPY WITH D & C N/A 05/19/2021   Procedure: DILATATION AND CURETTAGE, HYSTEROSCOPY WITH MYOSURE;  Surgeon: [name], MD;  Location: [facility];  Service: Gynecology;  Laterality: N/A;   REDUCTION MAMMAPLASTY Bilateral 2010   FAMILY HISTORY Family History  Problem Relation Age of Onset   Aneurysm Father    Ovarian cancer Maternal Aunt    Colon cancer Cousin    Colon cancer Cousin    Diabetes  Brother    Prostate cancer Neg Hx    Pancreatic cancer Neg Hx    Cancer - Colon Neg Hx    Endometrial cancer Neg Hx    SOCIAL HISTORY Social History   Tobacco Use   Smoking status: Never   Smokeless tobacco: Never  Vaping Use   Vaping status: Never Used  Substance Use Topics   Alcohol use: Not Currently    Comment: rare, 2 a year   Drug use: Never       OPHTHALMIC EXAM:  Not recorded    IMAGING AND PROCEDURES  Imaging and Procedures for 07/05/2024          ASSESSMENT/PLAN: No diagnosis found.  1-3. Moderate Non-proliferative  diabetic retinopathy, both eyes  - s/p IVA OD #1 (09.11.25), #2 (10.09.25)  - s/p IVA OS #1 (10.07.24), #2 (09.08.25), #3 (10.09.25)  - last A1c was 8.5 (08.04.25), 10.8 in September 2022 - exam shows scattered MA/DBH and exudates OU - FA (10.07.24) shows OD: Scattered leaking MA mostly posterior, No NV; OS: Focal clusters of leaking MA posteriorly greatest superior macula, no NV - OCT shows NI:Dphwpqprjwu improvement in central edema and foveal contour; OS: Interval increase in IRF/IRHM greatest superior mac with extension through fovea and + SRF; SRF slightly improved - BCVA OD 20/40 from 20/60; OS 20/40 - stable - recommend IVA OU (OD #3, OS #4) today, 10.09.25 for DME - pt wishes to proceed with injection - RBA of procedure discussed, questions answered - IVA informed consent obtained and signed, 10.07.24 - see procedure note - f/u in 4 wks -- DFE/OCT, possible injection(s)  4,5. Hypertensive retinopathy OU - discussed importance of tight BP control - monitor  6. Mixed Cataract OU - The symptoms of cataract, surgical options, and treatments and risks were discussed with patient. - discussed diagnosis and progression - monitor  Ophthalmic Meds Ordered this visit:  No orders of the defined types were placed in this encounter.    No follow-ups on file.  There are no Patient Instructions on file for this visit.    This document serves as a record of services personally performed by Redell JUDITHANN Hans, MD, PhD. It was created on their behalf by Almetta Pesa, an ophthalmic technician. The creation of this record is the provider's dictation and/or activities during the visit.    Electronically signed by: Almetta Pesa, OA, 07/03/24  8:17 AM  Redell JUDITHANN Hans, M.D., Ph.D. Diseases & Surgery of the Retina and Vitreous [facility]   Abbreviations: M myopia (nearsighted); A astigmatism; H hyperopia (farsighted); P presbyopia; Mrx spectacle prescription;   CTL contact lenses; OD right eye; OS left eye; OU both eyes  XT exotropia; ET esotropia; PEK punctate epithelial keratitis; PEE punctate epithelial erosions; DES dry eye syndrome; MGD meibomian gland dysfunction; ATs artificial tears; PFAT's preservative free artificial tears; NSC nuclear sclerotic cataract; PSC posterior subcapsular cataract; ERM epi-retinal membrane; PVD posterior vitreous detachment; RD retinal detachment; DM diabetes mellitus; DR diabetic retinopathy; NPDR non-proliferative diabetic retinopathy; PDR proliferative diabetic retinopathy; CSME clinically significant macular edema; DME diabetic macular edema; dbh dot blot hemorrhages; CWS cotton wool spot; POAG primary open angle glaucoma; C/D cup-to-disc ratio; HVF humphrey visual field; GVF goldmann visual field; OCT optical coherence tomography; IOP intraocular pressure; BRVO Branch retinal vein occlusion; CRVO central retinal vein occlusion; CRAO central retinal artery occlusion; BRAO branch retinal artery occlusion; RT retinal tear; SB scleral buckle; PPV pars plana vitrectomy; VH Vitreous hemorrhage; PRP panretinal laser photocoagulation;  IVK intravitreal kenalog; VMT vitreomacular traction; MH Macular hole;  NVD neovascularization of the disc; NVE neovascularization elsewhere; AREDS age related eye disease study; ARMD age related macular degeneration; POAG primary open angle glaucoma; EBMD epithelial/anterior basement membrane dystrophy; ACIOL anterior chamber intraocular lens; IOL intraocular lens; PCIOL posterior chamber intraocular lens; Phaco/IOL phacoemulsification with intraocular lens placement; PRK photorefractive keratectomy; LASIK laser assisted in situ keratomileusis; HTN hypertension; DM diabetes mellitus; COPD chronic obstructive pulmonary disease

## 2024-07-05 ENCOUNTER — Encounter (INDEPENDENT_AMBULATORY_CARE_PROVIDER_SITE_OTHER): Admitting: Ophthalmology

## 2024-07-05 DIAGNOSIS — Z7985 Long-term (current) use of injectable non-insulin antidiabetic drugs: Secondary | ICD-10-CM

## 2024-07-05 DIAGNOSIS — H35033 Hypertensive retinopathy, bilateral: Secondary | ICD-10-CM

## 2024-07-05 DIAGNOSIS — H25813 Combined forms of age-related cataract, bilateral: Secondary | ICD-10-CM

## 2024-07-05 DIAGNOSIS — E113313 Type 2 diabetes mellitus with moderate nonproliferative diabetic retinopathy with macular edema, bilateral: Secondary | ICD-10-CM

## 2024-07-05 DIAGNOSIS — Z7984 Long term (current) use of oral hypoglycemic drugs: Secondary | ICD-10-CM

## 2024-07-05 DIAGNOSIS — I10 Essential (primary) hypertension: Secondary | ICD-10-CM

## 2024-07-12 DIAGNOSIS — I10 Essential (primary) hypertension: Secondary | ICD-10-CM | POA: Diagnosis not present

## 2024-07-12 DIAGNOSIS — W19XXXA Unspecified fall, initial encounter: Secondary | ICD-10-CM | POA: Diagnosis not present

## 2024-07-12 DIAGNOSIS — S299XXA Unspecified injury of thorax, initial encounter: Secondary | ICD-10-CM | POA: Diagnosis not present

## 2024-07-13 DIAGNOSIS — S299XXA Unspecified injury of thorax, initial encounter: Secondary | ICD-10-CM | POA: Diagnosis not present

## 2024-07-13 DIAGNOSIS — M25569 Pain in unspecified knee: Secondary | ICD-10-CM | POA: Diagnosis not present

## 2024-08-03 DIAGNOSIS — H40003 Preglaucoma, unspecified, bilateral: Secondary | ICD-10-CM | POA: Diagnosis not present

## 2024-08-07 DIAGNOSIS — H2513 Age-related nuclear cataract, bilateral: Secondary | ICD-10-CM | POA: Diagnosis not present

## 2024-08-07 DIAGNOSIS — H40003 Preglaucoma, unspecified, bilateral: Secondary | ICD-10-CM | POA: Diagnosis not present

## 2024-08-07 DIAGNOSIS — H47233 Glaucomatous optic atrophy, bilateral: Secondary | ICD-10-CM | POA: Diagnosis not present

## 2024-08-07 DIAGNOSIS — E113413 Type 2 diabetes mellitus with severe nonproliferative diabetic retinopathy with macular edema, bilateral: Secondary | ICD-10-CM | POA: Diagnosis not present

## 2024-08-28 ENCOUNTER — Other Ambulatory Visit (HOSPITAL_COMMUNITY)
Admission: RE | Admit: 2024-08-28 | Discharge: 2024-08-28 | Disposition: A | Discharge status: Home / Self Care | Source: Ambulatory Visit | Attending: Obstetrics and Gynecology | Admitting: Obstetrics and Gynecology

## 2024-08-28 ENCOUNTER — Encounter: Payer: Self-pay | Admitting: Obstetrics and Gynecology

## 2024-08-28 ENCOUNTER — Ambulatory Visit (INDEPENDENT_AMBULATORY_CARE_PROVIDER_SITE_OTHER): Admitting: Obstetrics and Gynecology

## 2024-08-28 VITALS — BP 194/78 | HR 90 | Ht 66.0 in | Wt 346.0 lb

## 2024-08-28 DIAGNOSIS — Z124 Encounter for screening for malignant neoplasm of cervix: Secondary | ICD-10-CM

## 2024-08-28 DIAGNOSIS — I10 Essential (primary) hypertension: Secondary | ICD-10-CM | POA: Diagnosis not present

## 2024-08-28 DIAGNOSIS — Z1231 Encounter for screening mammogram for malignant neoplasm of breast: Secondary | ICD-10-CM

## 2024-08-28 DIAGNOSIS — Z01419 Encounter for gynecological examination (general) (routine) without abnormal findings: Secondary | ICD-10-CM

## 2024-08-28 NOTE — Patient Instructions (Signed)
 [facility] [address] [place], [place] [address]

## 2024-08-28 NOTE — Progress Notes (Signed)
 Obstetrics and Gynecology New Patient Evaluation  Appointment Date: 08/28/2024  OBGYN Clinic: [facility]  Primary Care Provider: [name]  Referring Provider: [name], MD  Chief Complaint:  Chief Complaint  Patient presents with   Annual Exam    History of Present Illness: Amy [name] is a 59 y.o.  G1P1 (LMP: 1 year ago), seen for the above chief complaint.   No VB, spotting or GYN s/s.  No chest pain, SOB, HA, visual s/s. Pt states she took her BP meds today  Review of Systems: Pertinent items are noted in HPI.   Patient Active Problem List   Diagnosis Date Noted   Type 2 diabetes mellitus with diabetic microalbuminuria, with long-term current use of insulin  (HCC) 03/28/2024   Type 2 diabetes mellitus with both eyes affected by moderate nonproliferative retinopathy and macular edema, with long-term current use of insulin  (HCC) 03/27/2024   BMI 50.0-59.9, adult (HCC) 06/18/2021   BMI 60.0-69.9, adult (HCC) 05/06/2021   Sleep apnea 05/05/2021   Essential hypertension 05/07/2019   Dyslipidemia 05/07/2019   Type 2 diabetes mellitus with hyperglycemia, with long-term current use of insulin  (HCC) 05/07/2019   Past Medical History:  Past Medical History:  Diagnosis Date   Anemia    Diabetes mellitus without complication (HCC)    Endometrial polyp 06/18/2021   Enlarged uterus 05/06/2021   HLD (hyperlipidemia)    Hypertension    OSA (obstructive sleep apnea)    APAP S10 4-20 obt 2021   PMB (postmenopausal bleeding) 05/06/2021   Sleep apnea    Past Surgical History:  Past Surgical History:  Procedure Laterality Date   APPENDECTOMY  2003   CESAREAN SECTION  1995   COLONOSCOPY N/A 06/13/2024   Procedure: COLONOSCOPY;  Surgeon: [name], MD;  Location: [facility];  Service: Gastroenterology;  Laterality: N/A;   COLONOSCOPY WITH PROPOFOL  N/A 12/13/2017   Procedure: COLONOSCOPY WITH PROPOFOL ;   Surgeon: [name], MD;  Location: [facility];  Service: Gastroenterology;  Laterality: N/A;   HYSTEROSCOPY WITH D & C N/A 05/19/2021   Procedure: DILATATION AND CURETTAGE, HYSTEROSCOPY WITH MYOSURE;  Surgeon: [name], MD;  Location: [facility];  Service: Gynecology;  Laterality: N/A;   REDUCTION MAMMAPLASTY Bilateral 2010   Past Obstetrical History:  OB History  Gravida Para Term Preterm AB Living  1 1      SAB IAB Ectopic Multiple Live Births          # Outcome Date GA Lbr Len/2nd Weight Sex Type Anes PTL Lv  1 Para            Past Gynecological History: As per HPI.  Social History:  Social History   Socioeconomic History   Marital status: Single    Spouse name: Not on file   Number of children: Not on file   Years of education: Not on file   Highest education level: Not on file  Occupational History   Not on file  Tobacco Use   Smoking status: Never   Smokeless tobacco: Never  Vaping Use   Vaping status: Never Used  Substance and Sexual Activity   Alcohol use: Not Currently    Comment: rare, 2 a year   Drug use: Never   Sexual activity: Not Currently    Birth control/protection: None  Other Topics Concern   Not on file  Social History Narrative   Not on file   Social Drivers of Health   Tobacco Use: Low Risk (08/28/2024)  Patient History    Smoking Tobacco Use: Never    Smokeless Tobacco Use: Never    Passive Exposure: Not on file  Financial Resource Strain: Not on file  Food Insecurity: Not on file  Transportation Needs: Not on file  Physical Activity: Not on file  Stress: Not on file  Social Connections: Not on file  Intimate Partner Violence: Not on file  Depression (PHQ2-9): Low Risk (08/28/2024)   Depression (PHQ2-9)    PHQ-2 Score: 2  Alcohol Screen: Not on file  Housing: Not on file  Utilities: Not on file  Health Literacy: Not on file   Family History:  Family History  Problem Relation Age of Onset   Aneurysm Father     Ovarian cancer Maternal Aunt    Colon cancer Cousin    Colon cancer Cousin    Diabetes Brother    Prostate cancer Neg Hx    Pancreatic cancer Neg Hx    Cancer - Colon Neg Hx    Endometrial cancer Neg Hx     Medications Demitra L. Hilger had no medications administered during this visit. Current Outpatient Medications  Medication Sig Dispense Refill   aspirin EC 81 MG tablet Take 81 mg by mouth every other day. Swallow whole.     atorvastatin  (LIPITOR) 40 MG tablet TAKE 1 TABLET BY MOUTH  DAILY 90 tablet 0   Cholecalciferol (VITAMIN D3) 25 MCG (1000 UT) CAPS Take 1,000 Units by mouth daily.     Dulaglutide  (TRULICITY ) 1.5 MG/0.5ML SOAJ Inject 1.5 mg into the skin once a week. 6 mL 3   insulin  glargine, 1 Unit Dial , (TOUJEO  SOLOSTAR) 300 UNIT/ML Solostar Pen Inject 40 Units into the skin daily in the afternoon. 15 mL 2   insulin  lispro (HUMALOG  KWIKPEN) 200 UNIT/ML KwikPen Inject 16-26 Units into the skin 3 (three) times daily. 30 mL 2   Insulin  Pen Needle 31G X 5 MM MISC 1 Device by Does not apply route in the morning, at noon, in the evening, and at bedtime. 400 each 3   irbesartan -hydrochlorothiazide  (AVALIDE) 300-12.5 MG tablet TAKE 1 TABLET BY MOUTH  DAILY 90 tablet 0   meloxicam  (MOBIC ) 7.5 MG tablet Take 1 tablet (7.5 mg total) by mouth daily. 10 tablet 0   metoprolol succinate (TOPROL-XL) 25 MG 24 hr tablet Take 25 mg by mouth daily.     Continuous Glucose Sensor (FREESTYLE LIBRE 3 PLUS SENSOR) MISC Change sensor every 15 days. 1 each 0   ferrous sulfate 325 (65 FE) MG tablet Take 325 mg by mouth daily with breakfast.     No current facility-administered medications for this visit.   Allergies Lisinopril and Amlodipine  Physical Exam:  Vitals:   08/28/24 1321 08/28/24 1333 08/28/24 1349  BP: (!) 195/92 (!) 202/99 (!) 194/78  Pulse: 91 90   Weight: (!) 346 lb (156.9 kg)    Height: 5' 6 (1.676 m)     General appearance: Well nourished, well developed female in no acute  distress.  Respiratory:  Normal respiratory effort Abdomen: obese, softy Neuro/Psych:  Normal mood and affect.  Skin:  Warm and dry.   Cervical exam performed in the presence of a chaperone Pelvic exam: is limited by body habitus EGBUS: within normal limits Vagina: within normal limits and with no blood or discharge in the vault Cervix: normal appearing cervix without tenderness, discharge or lesions Uterus:  nonenlarged and non tender Adnexa:  normal adnexa and no mass, fullness, tenderness Rectovaginal: deferred  Laboratory: none  Radiology: none  Assessment: patient stable  Plan: 1. Cervical cancer screening (Primary) - Cytology - PAP( [place])  2. Encounter for screening mammogram for malignant neoplasm of breast - MM Digital Screening; Future  3. Hypertension, unspecified type I told her I recommend she go to UC for evaluation. Risk of elevated BPs d/w her. Directions for [facility] urgent care given to her in case hospital evaluation needed  Orders Placed This Encounter  Procedures   MM Digital Screening    RTC PRN  No follow-ups on file.  Future Appointments  Date Time Provider Department Center  09/11/2024  9:15 AM [name], MD [facility] None    Bebe Izell Raddle MD Attending [facility])

## 2024-08-31 ENCOUNTER — Ambulatory Visit: Payer: Self-pay | Admitting: Obstetrics and Gynecology

## 2024-08-31 LAB — CYTOLOGY - PAP
Comment: NEGATIVE
Diagnosis: NEGATIVE
High risk HPV: NEGATIVE

## 2024-09-01 NOTE — Progress Notes (Signed)
 " [facility] - Clinic Note  09/11/2024   CHIEF COMPLAINT Patient presents for Retina Follow Up  HISTORY OF PRESENT ILLNESS: Amy [name] is a 59 y.o. female who presents to the clinic today for:  HPI     Retina Follow Up   Patient presents with  Diabetic Retinopathy.  In both eyes.  This started 1 year ago.  Duration of 15 weeks.  I, the attending physician,  performed the HPI with the patient and updated documentation appropriately.        Comments   Patient here for 4 weeks retina follow up (late has been 15 weeks) for NPDR OU. Patient states vision doing good. No eye pain.      Last edited by [name], MD on 09/11/2024 12:47 PM.     Pt states she was in [place]. Debby before missed appt in November then missed her appts in December due to holidays.    Referring physician: [name], MD [address] [place],  [place] [address]  HISTORICAL INFORMATION:  Selected notes from the MEDICAL RECORD NUMBER Referred by Dr. [name] for concern of DME LEE:  Ocular Hx- PMH-   CURRENT MEDICATIONS: No current outpatient medications on file. (Ophthalmic Drugs)   No current facility-administered medications for this visit. (Ophthalmic Drugs)   Current Outpatient Medications (Other)  Medication Sig   aspirin EC 81 MG tablet Take 81 mg by mouth every other day. Swallow whole.   atorvastatin  (LIPITOR) 40 MG tablet TAKE 1 TABLET BY MOUTH  DAILY   Cholecalciferol (VITAMIN D3) 25 MCG (1000 UT) CAPS Take 1,000 Units by mouth daily.   Continuous Glucose Sensor (FREESTYLE LIBRE 3 PLUS SENSOR) MISC Change sensor every 15 days.   Dulaglutide  (TRULICITY ) 1.5 MG/0.5ML SOAJ Inject 1.5 mg into the skin once a week.   ferrous sulfate 325 (65 FE) MG tablet Take 325 mg by mouth daily with breakfast.   insulin  glargine, 1 Unit Dial , (TOUJEO  SOLOSTAR) 300 UNIT/ML Solostar Pen Inject 40 Units into the skin daily in the afternoon.   insulin  lispro  (HUMALOG  KWIKPEN) 200 UNIT/ML KwikPen Inject 16-26 Units into the skin 3 (three) times daily.   Insulin  Pen Needle 31G X 5 MM MISC 1 Device by Does not apply route in the morning, at noon, in the evening, and at bedtime.   irbesartan -hydrochlorothiazide  (AVALIDE) 300-12.5 MG tablet TAKE 1 TABLET BY MOUTH  DAILY   meloxicam  (MOBIC ) 7.5 MG tablet Take 1 tablet (7.5 mg total) by mouth daily.   metoprolol succinate (TOPROL-XL) 25 MG 24 hr tablet Take 25 mg by mouth daily.   No current facility-administered medications for this visit. (Other)   REVIEW OF SYSTEMS:    ALLERGIES Allergies  Allergen Reactions   Lisinopril Swelling   Amlodipine     Dizzy   PAST MEDICAL HISTORY Past Medical History:  Diagnosis Date   Anemia    Diabetes mellitus without complication (HCC)    Endometrial polyp 06/18/2021   Enlarged uterus 05/06/2021   HLD (hyperlipidemia)    Hypertension    OSA (obstructive sleep apnea)    APAP S10 4-20 obt 2021   PMB (postmenopausal bleeding) 05/06/2021   Sleep apnea    Past Surgical History:  Procedure Laterality Date   APPENDECTOMY  2003   CESAREAN SECTION  1995   COLONOSCOPY N/A 06/13/2024   Procedure: COLONOSCOPY;  Surgeon: [name], MD;  Location: [facility];  Service: Gastroenterology;  Laterality: N/A;   COLONOSCOPY  WITH PROPOFOL  N/A 12/13/2017   Procedure: COLONOSCOPY WITH PROPOFOL ;  Surgeon: [name], MD;  Location: [facility];  Service: Gastroenterology;  Laterality: N/A;   HYSTEROSCOPY WITH D & C N/A 05/19/2021   Procedure: DILATATION AND CURETTAGE, HYSTEROSCOPY WITH MYOSURE;  Surgeon: [name], MD;  Location: [facility];  Service: Gynecology;  Laterality: N/A;   REDUCTION MAMMAPLASTY Bilateral 2010   FAMILY HISTORY Family History  Problem Relation Age of Onset   Aneurysm Father    Ovarian cancer Maternal Aunt    Colon cancer Cousin    Colon cancer Cousin    Diabetes Brother    Prostate cancer Neg Hx    Pancreatic  cancer Neg Hx    Cancer - Colon Neg Hx    Endometrial cancer Neg Hx    SOCIAL HISTORY Social History   Tobacco Use   Smoking status: Never   Smokeless tobacco: Never  Vaping Use   Vaping status: Never Used  Substance Use Topics   Alcohol use: Not Currently    Comment: rare, 2 a year   Drug use: Never       OPHTHALMIC EXAM:  Base Eye Exam     Visual Acuity (Snellen - Linear)       Right Left   Dist cc 20/40 -3 20/50 -3   Dist ph cc NI NI    Correction: Glasses         Tonometry (Tonopen, 9:13 AM)       Right Left   Pressure 20 20         Pupils       Pupils Dark Light Shape React APD   Right PERRL 3 2 Round Minimal None   Left PERRL 3 2 Round Minimal None         Visual Fields       Left Right    Full Full         Extraocular Movement       Right Left    Full, Ortho Full, Ortho         Neuro/Psych     Oriented x3: Yes   Mood/Affect: Normal         Dilation     Both eyes: 1.0% Mydriacyl, 2.5% Phenylephrine @ 9:13 AM           Slit Lamp and Fundus Exam     Slit Lamp Exam       Right Left   Lids/Lashes Dermatochalasis - upper lid Dermatochalasis - upper lid   Conjunctiva/Sclera Melanosis Melanosis   Cornea trace tear film debris arcus, trace tear film debris   Anterior Chamber deep and clear deep and clear   Iris Round and dilated, No NVI Round and dilated, No NVI   Lens 2+ Nuclear sclerosis, 2+ Cortical cataract, 1+ Posterior subcapsular cataract 2+ Cortical cataract, +Vacuoles, 2+ Nuclear sclerosis   Anterior Vitreous syneresis syneresis         Fundus Exam       Right Left   Disc Pink and Sharp, mild PPP Pink and Sharp, +cupping   C/D Ratio 0.6 0.65   Macula Blunted foveal reflex, scattered MA/DBH, edema and exudates greatest superior and nasal mac-- increased Blunted foveal reflex, scattered MA/DBH, edema and punctate exudates   Vessels attenuated, Tortuous, mild AV crossing changes, no NV attenuated, Tortuous,  mild copper wiring, mild AV crossing changes, no NV   Periphery Attached, scattered MA / DBH greatest nasal to disc Attached, rare MA / DBH  greatest nasal to disc, focal CWS inferior to disc           Refraction     Wearing Rx       Sphere Cylinder Axis Add   Right -2.50 +0.50 171 +2.25   Left -3.00 +1.00 013 +2.25           IMAGING AND PROCEDURES  Imaging and Procedures for 09/11/2024  OCT, Retina - OU - Both Eyes       Right Eye Quality was good. Central Foveal Thickness: 821. Progression has worsened. Findings include abnormal foveal contour, intraretinal hyper-reflective material, intraretinal fluid, subretinal fluid, vitreomacular adhesion (Massive interval increase in central edema w/ IRF and SRF).   Left Eye Quality was good. Central Foveal Thickness: 587. Progression has worsened. Findings include abnormal foveal contour, intraretinal hyper-reflective material, intraretinal fluid, subretinal fluid, vitreomacular adhesion (Interval increase in IRF/IRHM and SRF greatest superior mac with extension through fovea ).   Notes *Images captured and stored on drive  Diagnosis / Impression:  +DME OU OD: Massive interval increase in central edema w/ IRF and SRF OS: Interval increase in IRF/IRHM and SRF greatest superior mac with extension through fovea   Clinical management:  See below  Abbreviations: NFP - Normal foveal profile. CME - cystoid macular edema. PED - pigment epithelial detachment. IRF - intraretinal fluid. SRF - subretinal fluid. EZ - ellipsoid zone. ERM - epiretinal membrane. ORA - outer retinal atrophy. ORT - outer retinal tubulation. SRHM - subretinal hyper-reflective material. IRHM - intraretinal hyper-reflective material      Intravitreal Injection, Pharmacologic Agent - OD - Right Eye       Time Out 09/11/2024. 10:23 AM. Confirmed correct patient, procedure, site, and patient consented.   Anesthesia Topical anesthesia was used. Anesthetic  medications included Lidocaine  2%, Proparacaine 0.5%.   Procedure Preparation included 5% betadine to ocular surface, eyelid speculum. A (32g) needle was used.   Injection: 1.25 mg Bevacizumab  1.25mg /0.3ml   Route: Intravitreal, Site: Right Eye   NDC: H525437, Lot: [id number], Expiration date: 12/10/2024   Post-op Post injection exam found visual acuity of at least counting fingers. The patient tolerated the procedure well. There were no complications. The patient received written and verbal post procedure care education.      Intravitreal Injection, Pharmacologic Agent - OS - Left Eye       Time Out 09/11/2024. 10:23 AM. Confirmed correct patient, procedure, site, and patient consented.   Anesthesia Topical anesthesia was used. Anesthetic medications included Lidocaine  2%, Proparacaine 0.5%.   Procedure Preparation included 5% betadine to ocular surface, eyelid speculum. A (32g) needle was used.   Injection: 1.25 mg Bevacizumab  1.25mg /0.22ml   Route: Intravitreal, Site: Left Eye   NDC: H525437, Lot: [id number], Expiration date: 11/09/2024   Post-op Post injection exam found visual acuity of at least counting fingers. The patient tolerated the procedure well. There were no complications. The patient received written and verbal post procedure care education.           ASSESSMENT/PLAN:   ICD-10-CM   1. Moderate nonproliferative diabetic retinopathy of both eyes with macular edema associated with type 2 diabetes mellitus (HCC)  E11.3313 OCT, Retina - OU - Both Eyes    Intravitreal Injection, Pharmacologic Agent - OD - Right Eye    Intravitreal Injection, Pharmacologic Agent - OS - Left Eye    Bevacizumab  (AVASTIN ) SOLN 1.25 mg    Bevacizumab  (AVASTIN ) SOLN 1.25 mg    2. Long term (current) use of oral hypoglycemic drugs  Z79.84     3. Long-term (current) use of injectable non-insulin  antidiabetic drugs  Z79.85     4. Essential hypertension  I10     5. Hypertensive  retinopathy of both eyes  H35.033     6. Combined forms of age-related cataract of both eyes  H25.813      1-3. Moderate Non-proliferative diabetic retinopathy, both eyes **delayed f/u 14+weeks instead of 4weeks (10.09.25-01.19.26)** - s/p IVA OD #1 (09.11.25), #2 (10.09.25)  - s/p IVA OS #1 (10.07.24), #2 (09.08.25), #3 (10.09.25)  - last A1c was 8.5 (08.04.25), 10.8 in September 2022 - exam shows scattered MA/DBH and exudates OU - FA (10.07.24) shows OD: Scattered leaking MA mostly posterior, No NV; OS: Focal clusters of leaking MA posteriorly greatest superior macula, no NV - OCT shows OD: Massive interval increase in central edema w/ IRF and SRF; ND:Pwuzmcjo increase in IRF/IRHM and SRF greatest superior mac with extension through fovea at 14+ weeks - BCVA OD 20/40 stable; OS 20/50 from 20/40 - recommend IVA OU (OD #3, OS #4) today, 01.19.26 for DME - pt wishes to proceed with injections - RBA of procedure discussed, questions answered - IVA informed consent obtained and signed, 10.07.24 - see procedure note - f/u in 4 wks -- DFE/OCT, possible injection(s)  4,5. Hypertensive retinopathy OU - discussed importance of tight BP control - monitor   6. Mixed Cataract OU - The symptoms of cataract, surgical options, and treatments and risks were discussed with patient. - discussed diagnosis and progression - monitor   Ophthalmic Meds Ordered this visit:  Meds ordered this encounter  Medications   Bevacizumab  (AVASTIN ) SOLN 1.25 mg   Bevacizumab  (AVASTIN ) SOLN 1.25 mg     Return in about 4 weeks (around 10/09/2024) for NPDR OU, DFE, OCT, likely IVA OU.  There are no Patient Instructions on file for this visit.  Explained the diagnoses, plan, and follow up with the patient and they expressed understanding.  Patient expressed understanding of the importance of proper follow up care.   This document serves as a record of services personally performed by Redell JUDITHANN Hans, MD, PhD. It  was created on their behalf by Paulina Jamse Gay an ophthalmic technician. The creation of this record is the provider's dictation and/or activities during the visit.   Electronically signed by: Alana D Fowler  09/18/24  3:43 PM   This document serves as a record of services personally performed by Redell JUDITHANN Hans, MD, PhD. It was created on their behalf by Almetta Pesa, an ophthalmic technician. The creation of this record is the provider's dictation and/or activities during the visit.    Electronically signed by: Almetta Pesa, OA, 09/18/24  3:43 PM  Redell JUDITHANN Hans, M.D., Ph.D. Diseases & Surgery of the Retina and Vitreous [facility]  I have reviewed the above documentation for accuracy and completeness, and I agree with the above. Redell JUDITHANN Hans, M.D., Ph.D. 09/18/24 3:43 PM   Abbreviations: M myopia (nearsighted); A astigmatism; H hyperopia (farsighted); P presbyopia; Mrx spectacle prescription;  CTL contact lenses; OD right eye; OS left eye; OU both eyes  XT exotropia; ET esotropia; PEK punctate epithelial keratitis; PEE punctate epithelial erosions; DES dry eye syndrome; MGD meibomian gland dysfunction; ATs artificial tears; PFAT's preservative free artificial tears; NSC nuclear sclerotic cataract; PSC posterior subcapsular cataract; ERM epi-retinal membrane; PVD posterior vitreous detachment; RD retinal detachment; DM diabetes mellitus; DR diabetic retinopathy; NPDR non-proliferative diabetic retinopathy; PDR proliferative diabetic retinopathy; CSME clinically  significant macular edema; DME diabetic macular edema; dbh dot blot hemorrhages; CWS cotton wool spot; POAG primary open angle glaucoma; C/D cup-to-disc ratio; HVF humphrey visual field; GVF goldmann visual field; OCT optical coherence tomography; IOP intraocular pressure; BRVO Branch retinal vein occlusion; CRVO central retinal vein occlusion; CRAO central retinal artery occlusion; BRAO branch retinal  artery occlusion; RT retinal tear; SB scleral buckle; PPV pars plana vitrectomy; VH Vitreous hemorrhage; PRP panretinal laser photocoagulation; IVK intravitreal kenalog; VMT vitreomacular traction; MH Macular hole;  NVD neovascularization of the disc; NVE neovascularization elsewhere; AREDS age related eye disease study; ARMD age related macular degeneration; POAG primary open angle glaucoma; EBMD epithelial/anterior basement membrane dystrophy; ACIOL anterior chamber intraocular lens; IOL intraocular lens; PCIOL posterior chamber intraocular lens; Phaco/IOL phacoemulsification with intraocular lens placement; PRK photorefractive keratectomy; LASIK laser assisted in situ keratomileusis; HTN hypertension; DM diabetes mellitus; COPD chronic obstructive pulmonary disease  "

## 2024-09-11 ENCOUNTER — Encounter (INDEPENDENT_AMBULATORY_CARE_PROVIDER_SITE_OTHER): Payer: Self-pay | Admitting: Ophthalmology

## 2024-09-11 ENCOUNTER — Ambulatory Visit (INDEPENDENT_AMBULATORY_CARE_PROVIDER_SITE_OTHER): Admitting: Ophthalmology

## 2024-09-11 DIAGNOSIS — I10 Essential (primary) hypertension: Secondary | ICD-10-CM | POA: Diagnosis not present

## 2024-09-11 DIAGNOSIS — Z7984 Long term (current) use of oral hypoglycemic drugs: Secondary | ICD-10-CM

## 2024-09-11 DIAGNOSIS — Z7985 Long-term (current) use of injectable non-insulin antidiabetic drugs: Secondary | ICD-10-CM

## 2024-09-11 DIAGNOSIS — E113313 Type 2 diabetes mellitus with moderate nonproliferative diabetic retinopathy with macular edema, bilateral: Secondary | ICD-10-CM | POA: Diagnosis not present

## 2024-09-11 DIAGNOSIS — H25813 Combined forms of age-related cataract, bilateral: Secondary | ICD-10-CM

## 2024-09-11 DIAGNOSIS — H35033 Hypertensive retinopathy, bilateral: Secondary | ICD-10-CM

## 2024-09-11 MED ORDER — BEVACIZUMAB CHEMO INJECTION 1.25MG/0.05ML SYRINGE FOR KALEIDOSCOPE
1.2500 mg | INTRAVITREAL | Status: AC | PRN
  Administered 2024-09-11: 1.25 mg via INTRAVITREAL

## 2024-09-19 ENCOUNTER — Other Ambulatory Visit (INDEPENDENT_AMBULATORY_CARE_PROVIDER_SITE_OTHER): Payer: Self-pay

## 2024-10-10 ENCOUNTER — Encounter (INDEPENDENT_AMBULATORY_CARE_PROVIDER_SITE_OTHER): Admitting: Ophthalmology

## 2024-10-10 DIAGNOSIS — Z7984 Long term (current) use of oral hypoglycemic drugs: Secondary | ICD-10-CM

## 2024-10-10 DIAGNOSIS — H25813 Combined forms of age-related cataract, bilateral: Secondary | ICD-10-CM

## 2024-10-10 DIAGNOSIS — Z7985 Long-term (current) use of injectable non-insulin antidiabetic drugs: Secondary | ICD-10-CM

## 2024-10-10 DIAGNOSIS — I10 Essential (primary) hypertension: Secondary | ICD-10-CM

## 2024-10-10 DIAGNOSIS — E113313 Type 2 diabetes mellitus with moderate nonproliferative diabetic retinopathy with macular edema, bilateral: Secondary | ICD-10-CM

## 2024-10-10 DIAGNOSIS — H35033 Hypertensive retinopathy, bilateral: Secondary | ICD-10-CM
